# Patient Record
Sex: Female | Born: 1970 | Hispanic: Yes | Marital: Married | Smoking: Never smoker
Health system: Southern US, Community
[De-identification: ages and names within clinical notes are randomized; demographics above are authoritative.]

---

## 2007-11-22 ENCOUNTER — Ambulatory Visit: Payer: Self-pay | Admitting: Family Medicine

## 2008-01-08 ENCOUNTER — Ambulatory Visit: Payer: Self-pay | Admitting: Pediatrics

## 2015-11-24 ENCOUNTER — Encounter: Payer: Self-pay | Admitting: Family Medicine

## 2015-11-24 ENCOUNTER — Other Ambulatory Visit: Payer: Self-pay | Admitting: Student

## 2015-11-24 DIAGNOSIS — Z1231 Encounter for screening mammogram for malignant neoplasm of breast: Secondary | ICD-10-CM

## 2015-11-24 NOTE — Progress Notes (Signed)
This encounter was created in error - please disregard.

## 2015-11-27 ENCOUNTER — Ambulatory Visit
Admission: RE | Admit: 2015-11-27 | Discharge: 2015-11-27 | Disposition: A | Discharge status: Home / Self Care | Payer: Managed Care, Other (non HMO) | Source: Ambulatory Visit | Attending: Student | Admitting: Student

## 2015-11-27 DIAGNOSIS — Z1231 Encounter for screening mammogram for malignant neoplasm of breast: Secondary | ICD-10-CM

## 2016-04-22 ENCOUNTER — Emergency Department: Payer: Managed Care, Other (non HMO)

## 2016-04-22 ENCOUNTER — Encounter: Payer: Self-pay | Admitting: Emergency Medicine

## 2016-04-22 ENCOUNTER — Emergency Department
Admission: EM | Admit: 2016-04-22 | Discharge: 2016-04-22 | Disposition: A | Discharge status: Home / Self Care | Payer: Managed Care, Other (non HMO) | Attending: Student | Admitting: Student

## 2016-04-22 DIAGNOSIS — R200 Anesthesia of skin: Secondary | ICD-10-CM | POA: Insufficient documentation

## 2016-04-22 DIAGNOSIS — R51 Headache: Secondary | ICD-10-CM | POA: Insufficient documentation

## 2016-04-22 DIAGNOSIS — R519 Headache, unspecified: Secondary | ICD-10-CM

## 2016-04-22 DIAGNOSIS — R112 Nausea with vomiting, unspecified: Secondary | ICD-10-CM | POA: Insufficient documentation

## 2016-04-22 DIAGNOSIS — R42 Dizziness and giddiness: Secondary | ICD-10-CM

## 2016-04-22 LAB — TROPONIN I: Troponin I: <0.03 ng/mL (ref <0.03)

## 2016-04-22 LAB — COMPREHENSIVE METABOLIC PANEL
ALBUMIN: 4.7 g/dL (ref 3.5–5.0)
ALT: 13 U/L — ABNORMAL LOW (ref 14–54)
AST: 20 U/L (ref 15–41)
Alkaline Phosphatase: 49 U/L (ref 38–126)
Anion gap: 9 (ref 5–15)
BUN: 10 mg/dL (ref 6–20)
CHLORIDE: 108 mmol/L (ref 101–111)
CO2: 23 mmol/L (ref 22–32)
Calcium: 9.2 mg/dL (ref 8.9–10.3)
Creatinine, Ser: 0.66 mg/dL (ref 0.44–1.00)
GFR calc Af Amer: >60 mL/min (ref >60)
GFR calc non Af Amer: >60 mL/min (ref >60)
GLUCOSE: 127 mg/dL — AB (ref 65–99)
POTASSIUM: 3.6 mmol/L (ref 3.5–5.1)
Sodium: 140 mmol/L (ref 135–145)
Total Bilirubin: 0.1 mg/dL — ABNORMAL LOW (ref 0.3–1.2)
Total Protein: 8 g/dL (ref 6.5–8.1)

## 2016-04-22 LAB — URINALYSIS COMPLETE WITH MICROSCOPIC (ARMC ONLY)
BACTERIA UA: NONE SEEN
Bilirubin Urine: NEGATIVE
Glucose, UA: NEGATIVE mg/dL
Nitrite: NEGATIVE
PH: 7 (ref 5.0–8.0)
PROTEIN: NEGATIVE mg/dL
Specific Gravity, Urine: 1.002 — ABNORMAL LOW (ref 1.005–1.030)

## 2016-04-22 LAB — CBC WITH DIFFERENTIAL/PLATELET
BASOS ABS: 0.1 10*3/uL (ref 0–0.1)
BASOS PCT: 0 %
EOS PCT: 0 %
Eosinophils Absolute: 0 10*3/uL (ref 0–0.7)
HCT: 38.4 % (ref 35.0–47.0)
Hemoglobin: 12.9 g/dL (ref 12.0–16.0)
Lymphocytes Relative: 6 %
Lymphs Abs: 0.8 10*3/uL — ABNORMAL LOW (ref 1.0–3.6)
MCH: 28.4 pg (ref 26.0–34.0)
MCHC: 33.6 g/dL (ref 32.0–36.0)
MCV: 84.4 fL (ref 80.0–100.0)
MONO ABS: 0.2 10*3/uL (ref 0.2–0.9)
Monocytes Relative: 2 %
Neutro Abs: 12.6 10*3/uL — ABNORMAL HIGH (ref 1.4–6.5)
Neutrophils Relative %: 92 %
PLATELETS: 219 10*3/uL (ref 150–440)
RBC: 4.54 MIL/uL (ref 3.80–5.20)
RDW: 14.8 % — AB (ref 11.5–14.5)
WBC: 13.7 10*3/uL — ABNORMAL HIGH (ref 3.6–11.0)

## 2016-04-22 LAB — POC URINE PREG, ED: Preg Test, Ur: NEGATIVE

## 2016-04-22 MED ORDER — KETOROLAC TROMETHAMINE 30 MG/ML IJ SOLN
15.0000 mg | Freq: Once | INTRAMUSCULAR | Status: AC
  Administered 2016-04-22: 15 mg via INTRAVENOUS
  Filled 2016-04-22: qty 1

## 2016-04-22 MED ORDER — ONDANSETRON HCL 4 MG/2ML IJ SOLN
INTRAMUSCULAR | Status: AC
  Administered 2016-04-22: 4 mg via INTRAVENOUS
  Filled 2016-04-22: qty 2

## 2016-04-22 MED ORDER — SODIUM CHLORIDE 0.9 % IV BOLUS (SEPSIS)
1000.0000 mL | Freq: Once | INTRAVENOUS | Status: AC
  Administered 2016-04-22: 1000 mL via INTRAVENOUS

## 2016-04-22 MED ORDER — DIAZEPAM 5 MG PO TABS: 5.0000 mg | ORAL_TABLET | Freq: Two times a day (BID) | ORAL | 0 refills | Status: AC | PRN

## 2016-04-22 MED ORDER — LORAZEPAM 2 MG/ML IJ SOLN: 0.5000 mg | Freq: Once | INTRAMUSCULAR | Status: DC

## 2016-04-22 MED ORDER — MECLIZINE HCL 25 MG PO TABS
25.0000 mg | ORAL_TABLET | Freq: Once | ORAL | Status: AC
  Administered 2016-04-22: 25 mg via ORAL

## 2016-04-22 MED ORDER — ONDANSETRON HCL 4 MG/2ML IJ SOLN
4.0000 mg | Freq: Once | INTRAMUSCULAR | Status: AC
  Administered 2016-04-22: 4 mg via INTRAVENOUS

## 2016-04-22 MED ORDER — SODIUM CHLORIDE 0.9 % IV BOLUS (SEPSIS)
500.0000 mL | Freq: Once | INTRAVENOUS | Status: AC
  Administered 2016-04-22: 500 mL via INTRAVENOUS

## 2016-04-22 MED ORDER — LORAZEPAM 2 MG/ML IJ SOLN
0.5000 mg | Freq: Once | INTRAMUSCULAR | Status: AC
  Administered 2016-04-22: 0.5 mg via INTRAVENOUS
  Filled 2016-04-22: qty 1

## 2016-04-22 MED ORDER — MECLIZINE HCL 25 MG PO TABS: 25.0000 mg | ORAL_TABLET | Freq: Three times a day (TID) | ORAL | 0 refills | Status: AC

## 2016-04-22 MED ORDER — MECLIZINE HCL 25 MG PO TABS
ORAL_TABLET | ORAL | Status: AC
  Administered 2016-04-22: 25 mg via ORAL
  Filled 2016-04-22: qty 1

## 2016-04-22 NOTE — ED Notes (Signed)
MD at bedside. 

## 2016-04-22 NOTE — ED Notes (Addendum)
This nurse received phone call that patient's call light was going off at 1448.  This nurse went to check on patient at 1451.  Patient was not found in bed, 2 side rails were up, monitoring cords had been taken off, and bathroom door was closed.  This nurse knocked on bathroom door with no response but heard the toilet flush.  The patient opened the door and was standing at doorway.  The patient looked at this nurse and then leaned against the doorframe.  This nurse grabbed patient underneath her right arm to steady patient.  The patient then went completely limp in this nurses arms, eyes open, but not verbally responding to nurses requests.  This nurse gently lowered patient to sitting position on the floor.  Patient's arms and legs started shaking, patient responsive to painful stimuli at this time.  Dr. Inocencio HomesGayle walked into room at this time and assessed patient.  The patient got back into bed with minor assistance.  The interpretor was paged.  Per interpretor the patient used the call bell 5 times asking for assistance without any, states while using the bathroom she was trying to use the urine cup for a specimen and said she fell forward hitting her head on the handrail.  Patient with no redness or swelling to mid forehead and denies LOC.

## 2016-04-22 NOTE — ED Notes (Signed)
Patient ambulated with the assistance of MD with this RN.  Patient ambulated well with good gait and mobility.

## 2016-04-22 NOTE — ED Triage Notes (Addendum)
Pt comes into the ED via POV with her husband c/o N/V.  Patient brought back to room due to her becoming non-responsive to voice and became limp.  Patient placed in wheel chair and brought back to the room before triage.   Patient began staring into space and not answering questions until babinski reflex was performed.  Pupils reactive and brisk.  Patient has even and unlabored respirations but presents with tremors to bilateral legs.  Patient's husband denies any medical conditions and states she has been feeling Nausea and weak since this morning.

## 2016-04-22 NOTE — ED Notes (Signed)
MD updating family on patient care.

## 2016-04-22 NOTE — ED Notes (Signed)
Patient transported to MRI 

## 2016-04-22 NOTE — ED Provider Notes (Addendum)
-----------------------------------------   5:55 PM on 04/22/2016 -----------------------------------------   Blood pressure 106/63, pulse 71, temperature 98.2 F (36.8 C), temperature source Oral, resp. rate 14, height 5\' 2"  (1.575 m), weight 135 lb (61.2 kg), SpO2 99 %.  Assuming care from Dr. Inocencio HomesGayle of Becky AugustaMartha Oliva Carillo is a 45 y.o. female with a chief complaint of Nausea and Altered Mental Status .    In summary, [age] year old female who presents for evaluation of positional intermittent vertigo since this morning. Patient had negative MRI. Workup essentially unremarkable. Plan to reassess aftre PO meclizine, ativan, and IVF.  Patient ambulated with no difficulty at this time. Reports that her vertigo was markedly improved and symptoms very mild with movement but resolving within a few seconds. Normal neuro exam with no nystagmus, normal cranial nerves, no dysphasia, no dysarthria, normal palate elevation, no dysmetria, no pronator drift. 2nd troponin is negative.   She'll be discharged on meclizine, Valium, and Epley exercises.    Nita Sicklearolina Zuriyah Shatz, MD 04/22/16 [phone]

## 2016-04-22 NOTE — ED Provider Notes (Addendum)
[facility] Emergency Department Provider Note   ____________________________________________   First MD Initiated Contact with Patient 04/22/16 1239     (approximate)  I have reviewed the triage vital signs and the nursing notes.   HISTORY  Chief Complaint Nausea and Altered Mental Status    HPI Dominique Bryan is a 45 y.o. female with no chronic medical problems who presents for evaluation of nausea vomiting as well as room spinning dizziness intermittent since this morning, severe, worse when she moves her head. Patient is also had several episodes of nonbloody nonbilious emesis per she has never had anything like this before. She reports that the left side of her head hurts and feels "inflamed". She is complaining of some numbness and tingling in the left arm and face which she first noted at 7 AM this morning. No chest pain or difficulty breathing. No abdominal pain. No fevers or chills. Husband reports that she was at a funeral last night and is very "sensitive to peoples energy", he is wondering whether not that could be also affecting the patient.   History reviewed. No pertinent past medical history.  There are no active problems to display for this patient.   History reviewed. No pertinent surgical history.  Prior to Admission medications   Not on File    Allergies Review of patient's allergies indicates no known allergies.  No family history on file.  Social History Social History  Substance Use Topics  . Smoking status: Never Smoker  . Smokeless tobacco: Never Used  . Alcohol use No    Review of Systems Constitutional: No fever/chills Eyes: No visual changes. ENT: No sore throat. Cardiovascular: Denies chest pain. Respiratory: Denies shortness of breath. Gastrointestinal: No abdominal pain.  + nausea, + vomiting.  No diarrhea.  No constipation. Genitourinary: Negative for dysuria. Musculoskeletal: Negative for back  pain. Skin: Negative for rash. Neurological: Positive for headaches, +for numbness.  10-point ROS otherwise negative.  ____________________________________________   PHYSICAL EXAM:  Vitals:   04/22/16 1300 04/22/16 1507 04/22/16 1530 04/22/16 1600  BP: 113/68 116/69 126/77 126/68  Pulse: 77 75 80 74  Resp: 20 12 11  (!) 9  Temp:      TempSrc:      SpO2: 100% 100% 100% 100%  Weight:      Height:        VITAL SIGNS: ED Triage Vitals  Enc Vitals Group     BP 04/22/16 1235 (!) 157/96     Pulse Rate 04/22/16 1235 94     Resp 04/22/16 1235 18     Temp 04/22/16 1235 98.2 F (36.8 C)     Temp Source 04/22/16 1235 Oral     SpO2 04/22/16 1235 100 %     Weight 04/22/16 1225 120 lb (54.4 kg)     Height 04/22/16 1225 5\' 2"  (1.575 m)     Head Circumference --      Peak Flow --      Pain Score --      Pain Loc --      Pain Edu? --      Excl. in [place]? --     Constitutional: Alert and oriented. She appears very anxious, is tearful, tremulous in all extremities however this tremulousness resolves with distraction. Eyes: Conjunctivae are normal. PERRL. EOMI. Head: Atraumatic. Nose: No congestion/rhinnorhea. Ears: clear TMs bilaterally Mouth/Throat: Mucous membranes are moist.  Oropharynx non-erythematous. Neck: No stridor.  Supple without meningismus. Cardiovascular: Normal rate, regular rhythm. Grossly  normal heart sounds.  Good peripheral circulation. Respiratory: Normal respiratory effort.  No retractions. Lungs CTAB. Gastrointestinal: Soft and nontender. No distention.  No CVA tenderness. Genitourinary: Deferred Musculoskeletal: No lower extremity tenderness nor edema.  No joint effusions. Neurologic:  Normal speech and language. 5 out of 5 strength in bilateral upper and lower extremities, sensation is decreased to light touch in the left arm and face but otherwise intact sensation to light touch. 5 out of 5 strength in bilateral upper and lower extremities, cranial nerves II  through XII intact with the exception of slightly decreased sensation in the left face. Normal finger-nose-finger. She does have reproducible room spinning dizziness when she sits forward/changes positions. Skin:  Skin is warm, dry and intact. No rash noted. Psychiatric: Mood is anxious, affect is restricted. Speech is normal.  ____________________________________________   LABS (all labs ordered are listed, but only abnormal results are displayed)  Labs Reviewed  CBC WITH DIFFERENTIAL/PLATELET - Abnormal; Notable for the following:       Result Value   WBC 13.7 (*)    RDW 14.8 (*)    Neutro Abs 12.6 (*)    Lymphs Abs 0.8 (*)    All other components within normal limits  COMPREHENSIVE METABOLIC PANEL - Abnormal; Notable for the following:    Glucose, Bld 127 (*)    ALT 13 (*)    Total Bilirubin 0.1 (*)    All other components within normal limits  URINALYSIS COMPLETEWITH MICROSCOPIC ([facility] ONLY) - Abnormal; Notable for the following:    Color, Urine COLORLESS (*)    APPearance CLEAR (*)    Ketones, ur TRACE (*)    Specific Gravity, Urine 1.002 (*)    Hgb urine dipstick 2+ (*)    Leukocytes, UA TRACE (*)    Squamous Epithelial / LPF 0-5 (*)    All other components within normal limits  TROPONIN I  TROPONIN I  POC URINE PREG, ED   ____________________________________________  EKG  ED ECG REPORT I, Gayla Doss, the attending physician, personally viewed and interpreted this ECG.   Date: 04/22/2016  EKG Time: 12:39  Rate: 84  Rhythm: normal sinus rhythm  Axis: normal  Intervals:none  ST&T Change: No acute ST elevation. Minimal ST depression in lead 2, V4, V5.  ____________________________________________  RADIOLOGY  MRI brain IMPRESSION:  Normal MRI of the brain.      ____________________________________________   PROCEDURES  Procedure(s) performed: None  Procedures  Critical Care performed:  No  ____________________________________________   INITIAL IMPRESSION / ASSESSMENT AND PLAN / ED COURSE  Pertinent labs & imaging results that were available during my care of the patient were reviewed by me and considered in my medical decision making (see chart for details).  Dominique Augusta is a 45 y.o. female with no chronic medical problems who presents for evaluation of nausea vomiting as well as room spinning dizziness intermittent since this morning, severe, worse when she moves her head. On exam, she is severely anxious-appearing, tremulous however this resolves with distraction. Her vital signs are stable, she is afebrile. According to the nursing note, she was "staring into space" at times however on my assessment she is awake, alert, oriented. It is unclear to me what this initial staring episode was as I did not witness it, perhaps therewas confusion related to language barrier. She is complaining of pain in the left side of her head as well as a sensation that her head is "inflamed". She has reproducible dizziness with changes  in position and based on her description I suspect that this likely represents benign positional vertigo however given her presentation, how symptomatic she is in her complaint of subjective decreased sensation in the left side, we'll obtain MRI of the brain to evaluate for acute CVA. She denies any history of CVA or any family history of early CVA. EKG with minimal ST depression in isolated leads, no prior available for comparison, she denies chest pain or difficulty breathing, will check screening labs, cardiac enzymes, treat her symptomatically and reassess for disposition.  ----------------------------------------- 3:33 PM on 04/22/2016 ----------------------------------------- MRI is normal which is reassuring. I reviewed the patient's labs which show a mild leukocytosis which is nonspecific as well as normal CMP, negative troponin. Urinalysis not  consistent with infection. Patient reported to me that she got up from bed, walk to the bathroom and urinated but felt very dizzy and this caused her to collapse on the floor. She did not lose consciousness. She reports that the meclizine was helpful however her room spinning dizziness has recurred at this time. We'll treat with Ativan as I suspect some component of this is likely anxiety related though I think that she does have benign positional vertigo, we'll treat her headache and reassess for disposition. 2nd troponin pending given nonspecific EKG abnormalities. Care transferred to Dr. Don Perking at this time.  Clinical Course     ____________________________________________   FINAL CLINICAL IMPRESSION(S) / ED DIAGNOSES  Final diagnoses:  Vertigo  Acute nonintractable headache, unspecified headache type      NEW MEDICATIONS STARTED DURING THIS VISIT:  New Prescriptions   No medications on file     Note:  This document was prepared using Dragon voice recognition software and may include unintentional dictation errors.    Gayla Doss, MD 04/22/16 1536    Gayla Doss, MD 04/22/16 [phone]

## 2016-04-22 NOTE — ED Notes (Signed)
MD at bedside and patient is alert and oriented x4 while answering all questions .

## 2018-09-05 ENCOUNTER — Other Ambulatory Visit: Payer: Self-pay | Admitting: Family Medicine

## 2018-09-05 DIAGNOSIS — N644 Mastodynia: Secondary | ICD-10-CM

## 2018-09-05 DIAGNOSIS — N6310 Unspecified lump in the right breast, unspecified quadrant: Secondary | ICD-10-CM

## 2020-04-07 ENCOUNTER — Other Ambulatory Visit: Payer: Self-pay

## 2020-04-07 ENCOUNTER — Observation Stay
Admission: EM | Admit: 2020-04-07 | Discharge: 2020-04-08 | Disposition: A | Discharge status: Home / Self Care | Payer: BC Managed Care – PPO | Attending: Obstetrics and Gynecology | Admitting: Obstetrics and Gynecology

## 2020-04-07 ENCOUNTER — Encounter: Payer: Self-pay | Admitting: Emergency Medicine

## 2020-04-07 ENCOUNTER — Emergency Department: Payer: BC Managed Care – PPO

## 2020-04-07 DIAGNOSIS — R202 Paresthesia of skin: Secondary | ICD-10-CM | POA: Insufficient documentation

## 2020-04-07 DIAGNOSIS — Z20822 Contact with and (suspected) exposure to covid-19: Secondary | ICD-10-CM | POA: Diagnosis not present

## 2020-04-07 DIAGNOSIS — R519 Headache, unspecified: Principal | ICD-10-CM | POA: Insufficient documentation

## 2020-04-07 DIAGNOSIS — I639 Cerebral infarction, unspecified: Secondary | ICD-10-CM

## 2020-04-07 DIAGNOSIS — R29818 Other symptoms and signs involving the nervous system: Secondary | ICD-10-CM

## 2020-04-07 DIAGNOSIS — H539 Unspecified visual disturbance: Secondary | ICD-10-CM

## 2020-04-07 LAB — DIFFERENTIAL
Abs Immature Granulocytes: 0.03 10*3/uL (ref 0.00–0.07)
Basophils Absolute: 0.1 10*3/uL (ref 0.0–0.1)
Basophils Relative: 1 %
Eosinophils Absolute: 0.3 10*3/uL (ref 0.0–0.5)
Eosinophils Relative: 3 %
Immature Granulocytes: 0 %
Lymphocytes Relative: 16 %
Lymphs Abs: 1.5 10*3/uL (ref 0.7–4.0)
Monocytes Absolute: 0.6 10*3/uL (ref 0.1–1.0)
Monocytes Relative: 6 %
Neutro Abs: 6.8 10*3/uL (ref 1.7–7.7)
Neutrophils Relative %: 74 %

## 2020-04-07 LAB — COMPREHENSIVE METABOLIC PANEL
ALT: 21 U/L (ref 0–44)
AST: 23 U/L (ref 15–41)
Albumin: 4 g/dL (ref 3.5–5.0)
Alkaline Phosphatase: 74 U/L (ref 38–126)
Anion gap: 8 (ref 5–15)
BUN: 11 mg/dL (ref 6–20)
CO2: 24 mmol/L (ref 22–32)
Calcium: 8.6 mg/dL — ABNORMAL LOW (ref 8.9–10.3)
Chloride: 105 mmol/L (ref 98–111)
Creatinine, Ser: 0.64 mg/dL (ref 0.44–1.00)
GFR calc Af Amer: >60 mL/min (ref >60)
GFR calc non Af Amer: >60 mL/min (ref >60)
Glucose, Bld: 99 mg/dL (ref 70–99)
Potassium: 3.8 mmol/L (ref 3.5–5.1)
Sodium: 137 mmol/L (ref 135–145)
Total Bilirubin: 0.5 mg/dL (ref 0.3–1.2)
Total Protein: 7.7 g/dL (ref 6.5–8.1)

## 2020-04-07 LAB — CBC
HCT: 33.8 % — ABNORMAL LOW (ref 36.0–46.0)
Hemoglobin: 10.9 g/dL — ABNORMAL LOW (ref 12.0–15.0)
MCH: 27.8 pg (ref 26.0–34.0)
MCHC: 32.2 g/dL (ref 30.0–36.0)
MCV: 86.2 fL (ref 80.0–100.0)
Platelets: 307 10*3/uL (ref 150–400)
RBC: 3.92 MIL/uL (ref 3.87–5.11)
RDW: 14.2 % (ref 11.5–15.5)
WBC: 9.2 10*3/uL (ref 4.0–10.5)
nRBC: 0 % (ref 0.0–0.2)

## 2020-04-07 LAB — PROTIME-INR
INR: 1 (ref 0.8–1.2)
Prothrombin Time: 12.8 seconds (ref 11.4–15.2)

## 2020-04-07 LAB — APTT: aPTT: 30 seconds (ref 24–36)

## 2020-04-07 LAB — SEDIMENTATION RATE: Sed Rate: 27 mm/hr (ref 0–30)

## 2020-04-07 MED ORDER — DIPHENHYDRAMINE HCL 50 MG/ML IJ SOLN
25.0000 mg | Freq: Once | INTRAMUSCULAR | Status: AC
  Administered 2020-04-07: 25 mg via INTRAVENOUS
  Filled 2020-04-07: qty 1

## 2020-04-07 MED ORDER — SODIUM CHLORIDE 0.9% FLUSH: 3.0000 mL | Freq: Once | INTRAVENOUS | Status: DC

## 2020-04-07 MED ORDER — PROCHLORPERAZINE EDISYLATE 10 MG/2ML IJ SOLN
10.0000 mg | Freq: Once | INTRAMUSCULAR | Status: AC
  Administered 2020-04-07: 10 mg via INTRAVENOUS
  Filled 2020-04-07: qty 2

## 2020-04-07 NOTE — ED Notes (Signed)
Report off to chrissy rn c pod nurse 

## 2020-04-07 NOTE — Consult Note (Signed)
[facility] Tele[facility]urology Consult Services  Stat Consult  Date of Service:   04/07/2020 21:53:23  Impression:     .  R51.9 - Headache, unspecified  Comments/Sign-Out: 49 yr old woman, no known med hx, with headache, L sided and vision changes, bright lights, blurriness, L arm numbness since 3 days. NIH 1 for L arm sensory changes. CT head no acute changes, empty sella and sinus changes.   Diff Dx: complex migraine, r/o CVA, r/o CNS structural lesions, idiopathic intracranial hypertension syndrome, venous thrombosis, other. She is out of therapeutic window for any neuro intervention or alteplase since symptoms have been going on for > 72 hours.   Rec: - Headache treatment - MRI brain, w, wout gad, consider MRV and MRA brain. - consider LP if MRI unremarkable, and she is still symptomatic. - may need ophthalmological work up. - ASA - Neurology follow up d/w ER all of the above recs.  CT HEAD: Showed No Acute Hemorrhage or Acute Core Infarct Reviewed no acute changes, sinus changes, partial empty sella, ? idiopathic intracranial hypertension syndrome versus benign incidental finding.  Metrics: [employer] Notification Time: 04/07/2020 21:50:29 Stamp Time: 04/07/2020 21:53:23 Callback Response Time: 04/07/2020 21:52:50  Our recommendations are outlined below.  Recommendations:     .  Antiplatelet Therapy     .  Rec:     .  - Headache treatment     .  - MRI brain, w, wout gad, consider MRV and MRA brain.     .  - consider LP if MRI unremarkable, and she is still symptomatic.     .  - may need ophthalmological work up.     .  - ASA     .  - Neurology follow up     .  d/w ER all of the above recs.  Imaging Studies:     .  MRI Head with and Without Contrast  Therapies:     .  Physical Therapy, Occupational Therapy, Speech Therapy Assessment When Applicable  Other WorkUp:     .  Infectious/metabolic workup per primary team  Disposition: Neurology  Follow Up Recommended  Sign Out:     .  Discussed with Emergency Department Provider  ----------------------------------------------------------------------------------------------------  Chief Complaint: headache, Hx , physical exam done with Spanish Interpreter.  History of Present Illness: Patient is a 49 year old Female.  49 yr old woman, reports headache, L sided for 3 days, with fluctuating visual symptoms. She reports L more than R blurry vision and bright lights. She has had intermittent symptoms, came to ER today. She reports L arm has been tingling for last 3 days, since friday. She denied trauma, no diplopia, no speech changes, no focal weakness. She denied hx of CVA, HTN, DM, lipids, CAD, no cardiac hx per her. Hx and exam done with Spanish Interpreter.   Anticoagulant use:  No  Antiplatelet use: No    Examination: BP(128/81), Pulse(88), Blood Glucose(99) 1A: Level of Consciousness - Alert; keenly responsive + 0 1B: Ask Month and Age - Both Questions Right + 0 1C: Blink Eyes & Squeeze Hands - Performs Both Tasks + 0 2: Test Horizontal Extraocular Movements - Normal + 0 3: Test Visual Fields - No Visual Loss + 0 4: Test Facial Palsy (Use Grimace if Obtunded) - Normal symmetry + 0 5A: Test Left Arm Motor Drift - No Drift for 10 Seconds + 0 5B: Test Right Arm Motor Drift - No Drift for 10 Seconds + 0 6A: Test  Left Leg Motor Drift - No Drift for 5 Seconds + 0 6B: Test Right Leg Motor Drift - No Drift for 5 Seconds + 0 7: Test Limb Ataxia (FNF/Heel-Shin) - No Ataxia + 0 8: Test Sensation - Mild-Moderate Loss: Less Sharp/More Dull + 1 9: Test Language/Aphasia - Normal; No aphasia + 0 10: Test Dysarthria - Normal + 0 11: Test Extinction/Inattention - No abnormality + 0  NIHSS Score: 1   Patient/Family was informed the Neurology Consult would occur via TeleHealth consult by way of interactive audio and video telecommunications and consented to receiving care in this  manner.  Patient is being evaluated for possible acute neurologic impairment and high probability of imminent or life-threatening deterioration. I spent total of 25 minutes providing care to this patient, including time for face to face visit via telemedicine, review of medical records, imaging studies and discussion of findings with providers, the patient and/or family.   Dr Rubye Oaks   [facility] [phone]  Case [id number]

## 2020-04-07 NOTE — ED Notes (Signed)
Tele neuro consult now

## 2020-04-07 NOTE — ED Provider Notes (Signed)
[facility] Emergency Department Provider Note   ____________________________________________   First MD Initiated Contact with Patient 04/07/20 2107     (approximate)  I have reviewed the triage vital signs and the nursing notes.   HISTORY  Chief Complaint Headache    HPI Dominique Bryan is a 49 y.o. female with no significant past medical history who presents to the ED complaining of headache.  Patient reports she has had stabbing pain to the left side of her head for the past 3 to 4 days which is associated with bilateral vision changes along with left arm numbness and weakness.  She first noticed the weakness in her left arm 2 days ago with difficulty with her vision starting earlier today.  She states the vision seems blurry in both of her eyes but worse in her left eye.  She will occasionally have double vision and also reports that colors seem abnormal in her left eye.  She denies history of migraines or other headaches, has never had similar neurologic symptoms in the past.  She has not had any fevers, neck stiffness, cough, chest pain, or shortness of breath.  She has not taken anything for her headache prior to arrival.        History reviewed. No pertinent past medical history.  Patient Active Problem List   Diagnosis Date Noted  . Headache 04/07/2020    History reviewed. No pertinent surgical history.  Prior to Admission medications   Not on File    Allergies Patient has no known allergies.  History reviewed. No pertinent family history.  Social History Social History   Tobacco Use  . Smoking status: Never Smoker  . Smokeless tobacco: Never Used  Substance Use Topics  . Alcohol use: Never  . Drug use: Never    Review of Systems  Constitutional: No fever/chills Eyes: Positive for visual changes. ENT: No sore throat. Cardiovascular: Denies chest pain. Respiratory: Denies shortness of breath. Gastrointestinal: No  abdominal pain.  No nausea, no vomiting.  No diarrhea.  No constipation. Genitourinary: Negative for dysuria. Musculoskeletal: Negative for back pain. Skin: Negative for rash. Neurological: Positive for headaches, focal weakness and numbness.  ____________________________________________   PHYSICAL EXAM:  VITAL SIGNS: ED Triage Vitals  Enc Vitals Group     BP 04/07/20 1724 128/81     Pulse Rate 04/07/20 1724 78     Resp 04/07/20 1724 16     Temp 04/07/20 1724 98.9 F (37.2 C)     Temp Source 04/07/20 1724 Oral     SpO2 04/07/20 1724 100 %     Weight 04/07/20 1729 130 lb (59 kg)     Height 04/07/20 1729 5' 2.21" (1.58 m)     Head Circumference --      Peak Flow --      Pain Score 04/07/20 1724 8     Pain Loc --      Pain Edu? --      Excl. in [place]? --     Constitutional: Alert and oriented. Eyes: Conjunctivae are normal.  Pupils equal round and reactive to light bilaterally, extraocular movements intact. Head: Atraumatic. Nose: No congestion/rhinnorhea. Mouth/Throat: Mucous membranes are moist. Neck: Normal ROM Cardiovascular: Normal rate, regular rhythm. Grossly normal heart sounds. Respiratory: Normal respiratory effort.  No retractions. Lungs CTAB. Gastrointestinal: Soft and nontender. No distention. Genitourinary: deferred Musculoskeletal: No lower extremity tenderness nor edema. Neurologic:  Normal speech and language.  Decrease sensation noted over left upper extremity subjectively,  no focal weakness noted. Skin:  Skin is warm, dry and intact. No rash noted. Psychiatric: Mood and affect are normal. Speech and behavior are normal.  ____________________________________________   LABS (all labs ordered are listed, but only abnormal results are displayed)  Labs Reviewed  CBC - Abnormal; Notable for the following components:      Result Value   Hemoglobin 10.9 (*)    HCT 33.8 (*)    All other components within normal limits  COMPREHENSIVE METABOLIC PANEL -  Abnormal; Notable for the following components:   Calcium 8.6 (*)    All other components within normal limits  SARS CORONAVIRUS 2 BY RT PCR (HOSPITAL ORDER, [place] [facility])  PROTIME-INR  APTT  DIFFERENTIAL  SEDIMENTATION RATE  CBG MONITORING, ED  POC URINE PREG, ED    PROCEDURES  Procedure(s) performed (including Critical Care):  Procedures   ____________________________________________   INITIAL IMPRESSION / ASSESSMENT AND PLAN / ED COURSE       49 year old female with no significant past medical history presents to the ED complaining of 3 to 4 days of headache along with left arm numbness and weakness and bilateral vision changes.  She seems to have decreased sensation to her left upper extremity on exam but otherwise no focal neurologic deficits, visual acuity mildly impaired at 20/25 in both eyes.  Intraocular pressures were checked and within normal limits.  CT head negative for acute process, but does show partial empty sella, which could be associated with idiopathic intracranial hypertension.  Patient treated with migraine cocktail for potential complex migraine, now states that headache is improved but other neurologic symptoms remain.  Very low suspicion for giant cell arteritis given ESR within normal limits.  Patient evaluated by teleneurologist, who recommends admission for MRI and consideration of lumbar puncture tomorrow.  I doubt meningitis at this time and LP would be most useful to assess for opening pressure.  Case discussed with hospitalist for admission.      ____________________________________________   FINAL CLINICAL IMPRESSION(S) / ED DIAGNOSES  Final diagnoses:  Acute intractable headache, unspecified headache type     ED Discharge Orders    None       Note:  This document was prepared using Dragon voice recognition software and may include unintentional dictation errors.   Blake Divine, MD 04/07/20 [phone]

## 2020-04-07 NOTE — ED Notes (Signed)
primedoc in with pt and family 

## 2020-04-07 NOTE — ED Triage Notes (Signed)
[employer] interpretor used  [id number]  Pt c/o headache to left side of head and face.  Pain started Saturday.   Reports Sunday lost vision in left eyebut it returned after she took a nap, unsure how long vision was gone.  When vision came back blurry and double in left eye.  "I see several lights of several colors".  Has had nausea.

## 2020-04-07 NOTE — ED Notes (Addendum)
Pt ambulatory to treatment room  Pt has numbness and tingling on left side of face.  Pt has blurred vision.  Pt reports a headache.  Pt has nausea.  Sx for 3 days.    Pt alert  Speech clear  md at bedside.  Interpreter on a stick used.

## 2020-04-08 ENCOUNTER — Observation Stay: Payer: BC Managed Care – PPO

## 2020-04-08 ENCOUNTER — Observation Stay
Admit: 2020-04-08 | Discharge: 2020-04-08 | Disposition: A | Discharge status: Home / Self Care | Payer: BC Managed Care – PPO | Attending: Internal Medicine | Admitting: Internal Medicine

## 2020-04-08 ENCOUNTER — Encounter: Payer: Self-pay | Admitting: Internal Medicine

## 2020-04-08 DIAGNOSIS — R29818 Other symptoms and signs involving the nervous system: Secondary | ICD-10-CM

## 2020-04-08 DIAGNOSIS — R519 Headache, unspecified: Secondary | ICD-10-CM | POA: Diagnosis not present

## 2020-04-08 LAB — IRON AND TIBC
Iron: 41 ug/dL (ref 28–170)
Saturation Ratios: 11 % (ref 10.4–31.8)
TIBC: 385 ug/dL (ref 250–450)
UIBC: 344 ug/dL

## 2020-04-08 LAB — HIV ANTIBODY (ROUTINE TESTING W REFLEX): HIV Screen 4th Generation wRfx: NONREACTIVE

## 2020-04-08 LAB — CBC
HCT: 30.6 % — ABNORMAL LOW (ref 36.0–46.0)
Hemoglobin: 10 g/dL — ABNORMAL LOW (ref 12.0–15.0)
MCH: 28.1 pg (ref 26.0–34.0)
MCHC: 32.7 g/dL (ref 30.0–36.0)
MCV: 86 fL (ref 80.0–100.0)
Platelets: 265 10*3/uL (ref 150–400)
RBC: 3.56 MIL/uL — ABNORMAL LOW (ref 3.87–5.11)
RDW: 14 % (ref 11.5–15.5)
WBC: 7 10*3/uL (ref 4.0–10.5)
nRBC: 0 % (ref 0.0–0.2)

## 2020-04-08 LAB — C-REACTIVE PROTEIN: CRP: 0.6 mg/dL (ref <1.0)

## 2020-04-08 LAB — LIPID PANEL
Cholesterol: 162 mg/dL (ref 0–200)
HDL: 44 mg/dL (ref >40)
LDL Cholesterol: 102 mg/dL — ABNORMAL HIGH (ref 0–99)
Total CHOL/HDL Ratio: 3.7 RATIO
Triglycerides: 78 mg/dL (ref <150)
VLDL: 16 mg/dL (ref 0–40)

## 2020-04-08 LAB — FERRITIN: Ferritin: 7 ng/mL — ABNORMAL LOW (ref 11–307)

## 2020-04-08 LAB — HEMOGLOBIN A1C
Hgb A1c MFr Bld: 5.4 % (ref 4.8–5.6)
Mean Plasma Glucose: 108.28 mg/dL

## 2020-04-08 LAB — SEDIMENTATION RATE: Sed Rate: 33 mm/hr — ABNORMAL HIGH (ref 0–30)

## 2020-04-08 LAB — SARS CORONAVIRUS 2 BY RT PCR (HOSPITAL ORDER, PERFORMED IN ~~LOC~~ HOSPITAL LAB): SARS Coronavirus 2: NEGATIVE

## 2020-04-08 MED ORDER — DEXAMETHASONE SODIUM PHOSPHATE 10 MG/ML IJ SOLN
10.0000 mg | Freq: Once | INTRAMUSCULAR | Status: AC
  Administered 2020-04-08: 10 mg via INTRAVENOUS
  Filled 2020-04-08: qty 1

## 2020-04-08 MED ORDER — ASPIRIN EC 81 MG PO TBEC
81.0000 mg | DELAYED_RELEASE_TABLET | Freq: Every day | ORAL | Status: DC
  Administered 2020-04-08: 81 mg via ORAL
  Filled 2020-04-08: qty 1

## 2020-04-08 MED ORDER — ACETAMINOPHEN 325 MG PO TABS: 650.0000 mg | ORAL_TABLET | ORAL | Status: DC | PRN

## 2020-04-08 MED ORDER — ACETAMINOPHEN 650 MG RE SUPP: 650.0000 mg | RECTAL | Status: DC | PRN

## 2020-04-08 MED ORDER — ENOXAPARIN SODIUM 40 MG/0.4ML ~~LOC~~ SOLN
40.0000 mg | SUBCUTANEOUS | Status: DC
  Filled 2020-04-08: qty 0.4

## 2020-04-08 MED ORDER — MAGNESIUM SULFATE 50 % IJ SOLN: 2.0000 g | Freq: Once | INTRAMUSCULAR | Status: DC

## 2020-04-08 MED ORDER — STROKE: EARLY STAGES OF RECOVERY BOOK: Freq: Once | Status: DC

## 2020-04-08 MED ORDER — MAGNESIUM SULFATE 2 GM/50ML IV SOLN
2.0000 g | Freq: Once | INTRAVENOUS | Status: AC
  Administered 2020-04-08: 2 g via INTRAVENOUS
  Filled 2020-04-08: qty 50

## 2020-04-08 MED ORDER — GADOBUTROL 1 MMOL/ML IV SOLN
5.0000 mL | Freq: Once | INTRAVENOUS | Status: AC | PRN
  Administered 2020-04-08: 5 mL via INTRAVENOUS
  Filled 2020-04-08: qty 6

## 2020-04-08 MED ORDER — ACETAMINOPHEN 160 MG/5ML PO SOLN
650.0000 mg | ORAL | Status: DC | PRN
  Filled 2020-04-08: qty 20.3

## 2020-04-08 NOTE — ED Notes (Signed)
Pt transported to MRI 

## 2020-04-08 NOTE — Consult Note (Signed)
Reason for Consult: headahce and L sided weakness Requesting Physician: Dr. Ashok Pall   CC: L sided headache    HPI: Dominique Bryan is an 49 y.o. female with no significant past medical history awas in her usual state of health until 3 days prior when she started experiencing headache in the frontal area on L side waxed and waned and then developed intermittent blurred and double vision worse on the left as well as numbness and tingling of the left arm.    Associated with the symptoms were nausea and a little dizziness.  She denied fever or chills or neck stiffness.States that a month prior her son was hospitalized with an infection, adenovirus.  Patient presented with L sided weakness that has improved.   History reviewed. No pertinent past medical history.  History reviewed. No pertinent surgical history.  Family History  Problem Relation Age of Onset  . Diabetes Mother   . Prostate cancer Father     Social History:  reports that she has never smoked. She has never used smokeless tobacco. She reports that she does not drink alcohol and does not use drugs.  No Known Allergies  Medications: Prior to Admission: (Not in a hospital admission)   ROS: History obtained from the patient  General ROS: negative for - chills, fatigue, fever, night sweats, weight gain or weight loss Psychological ROS: negative for - behavioral disorder, hallucinations, memory difficulties, mood swings or suicidal ideation Ophthalmic ROS: negative for - blurry vision, double vision, eye pain or loss of vision ENT ROS: negative for - epistaxis, nasal discharge, oral lesions, sore throat, tinnitus or vertigo Allergy and Immunology ROS: negative for - hives or itchy/watery eyes Hematological and Lymphatic ROS: negative for - bleeding problems, bruising or swollen lymph nodes Endocrine ROS: negative for - galactorrhea, hair pattern changes, polydipsia/polyuria or temperature intolerance Respiratory ROS: negative  for - cough, hemoptysis, shortness of breath or wheezing Cardiovascular ROS: negative for - chest pain, dyspnea on exertion, edema or irregular heartbeat Gastrointestinal ROS: negative for - abdominal pain, diarrhea, hematemesis, nausea/vomiting or stool incontinence Genito-Urinary ROS: negative for - dysuria, hematuria, incontinence or urinary frequency/urgency Musculoskeletal ROS: negative for - joint swelling or muscular weakness Neurological ROS: as noted in HPI Dermatological ROS: negative for rash and skin lesion changes  Physical Examination: Blood pressure 99/63, pulse 86, temperature 98.9 F (37.2 C), temperature source Oral, resp. rate 16, height 5' 2.21" (1.58 m), weight 59 kg, last menstrual period 04/07/2020, SpO2 99 %.   Neurological Examination   Mental Status: Alert, oriented, thought content appropriate.  Speech fluent without evidence of aphasia.  Able to follow 3 step commands without difficulty. Pt is spanish speaking  Cranial Nerves: II: Discs flat bilaterally; Visual fields grossly normal, pupils equal, round, reactive to light and accommodation III,IV, VI: ptosis not present, extra-ocular motions intact bilaterally V,VII: smile symmetric, facial light touch sensation normal bilaterally VIII: hearing normal bilaterally IX,X: gag reflex present XI: bilateral shoulder shrug XII: midline tongue extension Motor: Right : Upper extremity   5/5    Left:     Upper extremity   5/5  Lower extremity   5/5     Lower extremity   5/5 Tone and bulk:normal tone throughout; no atrophy noted Sensory: Pinprick and light touch intact throughout, bilaterally Deep Tendon Reflexes: 2+ and symmetric throughout Plantars: Right: downgoing   Left: downgoing Cerebellar: normal finger-to-nose, normal rapid alternating movements and normal heel-to-shin test Gait: not tested      Laboratory Studies:  Basic Metabolic Panel: Recent Labs  Lab 04/07/20 1739  NA 137  K 3.8  CL 105   CO2 24  GLUCOSE 99  BUN 11  CREATININE 0.64  CALCIUM 8.6*    Liver Function Tests: Recent Labs  Lab 04/07/20 1739  AST 23  ALT 21  ALKPHOS 74  BILITOT 0.5  PROT 7.7  ALBUMIN 4.0   No results for input(s): LIPASE, AMYLASE in the last 168 hours. No results for input(s): AMMONIA in the last 168 hours.  CBC: Recent Labs  Lab 04/07/20 1739  WBC 9.2  NEUTROABS 6.8  HGB 10.9*  HCT 33.8*  MCV 86.2  PLT 307    Cardiac Enzymes: No results for input(s): CKTOTAL, CKMB, CKMBINDEX, TROPONINI in the last 168 hours.  BNP: Invalid input(s): POCBNP  CBG: No results for input(s): GLUCAP in the last 168 hours.  Microbiology: Results for orders placed or performed during the hospital encounter of 04/07/20  SARS Coronavirus 2 by RT PCR (hospital order, performed in [facility] hospital lab) Nasopharyngeal Nasopharyngeal Swab     Status: None   Collection Time: 04/07/20 10:49 PM   Specimen: Nasopharyngeal Swab  Result Value Ref Range Status   SARS Coronavirus 2 NEGATIVE NEGATIVE Final    Comment: (NOTE) SARS-CoV-2 target nucleic acids are NOT DETECTED.  The SARS-CoV-2 RNA is generally detectable in upper and lower respiratory specimens during the acute phase of infection. The lowest concentration of SARS-CoV-2 viral copies this assay can detect is 250 copies / mL. A negative result does not preclude SARS-CoV-2 infection and should not be used as the sole basis for treatment or other patient management decisions.  A negative result may occur with improper specimen collection / handling, submission of specimen other than nasopharyngeal swab, presence of viral mutation(s) within the areas targeted by this assay, and inadequate number of viral copies (<250 copies / mL). A negative result must be combined with clinical observations, patient history, and epidemiological information.  Fact Sheet for Patients:   BoilerBrush.com.cy  Fact Sheet for  Healthcare Providers: https://pope.com/  This test is not yet approved or  cleared by the [place] FDA and has been authorized for detection and/or diagnosis of SARS-CoV-2 by FDA under an Emergency Use Authorization (EUA).  This EUA will remain in effect (meaning this test can be used) for the duration of the COVID-19 declaration under Section 564(b)(1) of the Act, 21 U.S.C. section 360bbb-3(b)(1), unless the authorization is terminated or revoked sooner.  Performed at [facility], [address] [place], [place] [address]     Coagulation Studies: Recent Labs    04/07/20 1739  LABPROT 12.8  INR 1.0    Urinalysis: No results for input(s): COLORURINE, LABSPEC, PHURINE, GLUCOSEU, HGBUR, BILIRUBINUR, KETONESUR, PROTEINUR, UROBILINOGEN, NITRITE, LEUKOCYTESUR in the last 168 hours.  Invalid input(s): APPERANCEUR  Lipid Panel:     Component Value Date/Time   CHOL 162 04/08/2020 0359   TRIG 78 04/08/2020 0359   HDL 44 04/08/2020 0359   CHOLHDL 3.7 04/08/2020 0359   VLDL 16 04/08/2020 0359   LDLCALC 102 (H) 04/08/2020 0359    HgbA1C: No results found for: HGBA1C  Urine Drug Screen:  No results found for: LABOPIA, COCAINSCRNUR, LABBENZ, AMPHETMU, THCU, LABBARB  Alcohol Level: No results for input(s): ETH in the last 168 hours.  Other results: EKG: normal EKG, normal sinus rhythm, unchanged from previous tracings.  Imaging: CT HEAD WO CONTRAST  Result Date: 04/07/2020 CLINICAL DATA:  Vision loss, monocular. Additional history provided:  Patient reports headache to left side of head and face, symptoms began on Saturday, loss of vision in left eye on Sunday but vision returned. EXAM: CT HEAD WITHOUT CONTRAST TECHNIQUE: Contiguous axial images were obtained from the base of the skull through the vertex without intravenous contrast. COMPARISON:  No pertinent prior exams are available for comparison. FINDINGS: Brain: Cerebral volume is normal.  There is no acute intracranial hemorrhage. No demarcated cortical infarct. No extra-axial fluid collection. No evidence of intracranial mass. No midline shift. Partially empty sella turcica. Vascular: No hyperdense vessel. Skull: Normal. Negative for fracture or focal lesion. Sinuses/Orbits: Visualized orbits show no acute finding. Frothy secretions and severe mucosal thickening within the right maxillary sinus. Scattered frothy secretions and mild mucosal thickening within bilateral ethmoid air cells. Mild right sphenoid sinus mucosal thickening. Small right frontal sinus osteoma. No significant mastoid effusion. IMPRESSION: No CT evidence of acute intracranial abnormality. Partially empty sella turcica. This finding is very commonly incidental, but can be associated with idiopathic intracranial hypertension. Paranasal sinus disease, most notably, ethmoid and right maxillary sinusitis. Electronically Signed   By: Jackey Loge DO   On: 04/07/2020 18:29   MR Brain W and Wo Contrast  Result Date: 04/08/2020 CLINICAL DATA:  Initial evaluation for acute headache. EXAM: MRI HEAD WITHOUT AND WITH CONTRAST MRV HEAD WITHOUT AND WITH CONTRAST TECHNIQUE: Multiplanar, multiecho pulse sequences of the brain and surrounding structures were obtained without and with intravenous contrast. Angiographic images of the intracranial venous structures were obtained using MRV technique without intravenous contrast. CONTRAST:  55mL GADAVIST GADOBUTROL 1 MMOL/ML IV SOLN COMPARISON:  Prior head CT from 04/07/2020. FINDINGS: MRI HEAD FINDINGS: Brain: Cerebral volume within normal limits for patient age. No focal parenchymal signal abnormality identified. No abnormal foci of restricted diffusion to suggest acute or subacute ischemia. Gray-white matter differentiation well maintained. No encephalomalacia to suggest chronic infarction. No foci of susceptibility artifact to suggest acute or chronic intracranial hemorrhage. No mass lesion,  midline shift or mass effect. No hydrocephalus. No extra-axial fluid collection. Major dural sinuses are grossly patent. Pituitary gland and suprasellar region are normal. Midline structures intact and normal. No abnormal enhancement. Vascular: Major intracranial vascular flow voids well maintained and normal in appearance. Skull and upper cervical spine: Craniocervical junction normal. Visualized upper cervical spine within normal limits. Diffusely decreased T1 signal intensity seen throughout the visualized bone marrow, nonspecific, but most commonly related to anemia, smoking, or obesity. No focal marrow replacing lesion. No scalp soft tissue abnormality. Sinuses/Orbits: Globes and orbital soft tissues within normal limits. Moderate mucosal thickening noted within the ethmoidal air cells, sphenoid sinuses, and maxillary sinuses, right greater than left. Trace right mastoid effusion noted, of doubtful significance. Inner ear structures grossly normal. Other: None. MRV HEAD FINDINGS: Normal flow related signal seen throughout the superior sagittal sinus to the level of the torcula. Torcula itself is patent. Transverse and sigmoid sinuses are patent as are the visualized proximal internal jugular veins. Straight sinus, vein of Galen, internal cerebral veins, and basal veins of Rosenthal are patent. No evidence for dural sinus thrombosis. No dural sinus stenosis identified. IMPRESSION: 1. Normal brain MRI. No acute intracranial abnormality identified. 2. Moderate paranasal sinus disease, most pronounced within the ethmoidal air cells and maxillary sinuses, right greater than left. 3. Normal intracranial MRV. No evidence for dural sinus thrombosis. Electronically Signed   By: Rise Mu M.D.   On: 04/08/2020 02:20   US Carotid Bilateral (at [facility] and [facility] only)  Result Date: 04/08/2020  CLINICAL DATA:  Stroke.  Left-sided headache.  Double vision. EXAM: BILATERAL CAROTID DUPLEX ULTRASOUND TECHNIQUE: Wallace Cullens  scale imaging, color Doppler and duplex ultrasound were performed of bilateral carotid and vertebral arteries in the neck. COMPARISON:  None. FINDINGS: Criteria: Quantification of carotid stenosis is based on velocity parameters that correlate the residual internal carotid diameter with NASCET-based stenosis levels, using the diameter of the distal internal carotid lumen as the denominator for stenosis measurement. The following velocity measurements were obtained: RIGHT ICA: 101/46 cm/sec CCA: 104/29 cm/sec SYSTOLIC ICA/CCA RATIO:  1.0 ECA: 129 cm/sec LEFT ICA: 110/47 cm/sec CCA: 81/28 cm/sec SYSTOLIC ICA/CCA RATIO:  1.4 ECA: 130 cm/sec RIGHT CAROTID ARTERY: There is no grayscale evidence of significant intimal thickening or atherosclerotic plaque affecting the interrogated portions of the right carotid system. There are no elevated peak systolic velocities within the interrogated course of the right internal carotid artery to suggest a hemodynamically significant stenosis. RIGHT VERTEBRAL ARTERY:  Antegrade flow LEFT CAROTID ARTERY: There is a very minimal amount of intimal thickening/atherosclerotic plaque involving the distal aspect left common carotid artery (image 46). There are no elevated peak systolic velocities within the interrogated course of the left internal carotid artery to suggest a hemodynamically significant stenosis. LEFT VERTEBRAL ARTERY:  Antegrade flow IMPRESSION: 1. Minimal amount of left-sided intimal thickening/atherosclerotic plaque, not resulting in a hemodynamically significant stenosis. 2. Normal sonographic evaluation of the right carotid system. Electronically Signed   By: Simonne Come M.D.   On: 04/08/2020 07:42   MR MRV HEAD W WO CONTRAST  Result Date: 04/08/2020 CLINICAL DATA:  Initial evaluation for acute headache. EXAM: MRI HEAD WITHOUT AND WITH CONTRAST MRV HEAD WITHOUT AND WITH CONTRAST TECHNIQUE: Multiplanar, multiecho pulse sequences of the brain and surrounding structures  were obtained without and with intravenous contrast. Angiographic images of the intracranial venous structures were obtained using MRV technique without intravenous contrast. CONTRAST:  91mL GADAVIST GADOBUTROL 1 MMOL/ML IV SOLN COMPARISON:  Prior head CT from 04/07/2020. FINDINGS: MRI HEAD FINDINGS: Brain: Cerebral volume within normal limits for patient age. No focal parenchymal signal abnormality identified. No abnormal foci of restricted diffusion to suggest acute or subacute ischemia. Gray-white matter differentiation well maintained. No encephalomalacia to suggest chronic infarction. No foci of susceptibility artifact to suggest acute or chronic intracranial hemorrhage. No mass lesion, midline shift or mass effect. No hydrocephalus. No extra-axial fluid collection. Major dural sinuses are grossly patent. Pituitary gland and suprasellar region are normal. Midline structures intact and normal. No abnormal enhancement. Vascular: Major intracranial vascular flow voids well maintained and normal in appearance. Skull and upper cervical spine: Craniocervical junction normal. Visualized upper cervical spine within normal limits. Diffusely decreased T1 signal intensity seen throughout the visualized bone marrow, nonspecific, but most commonly related to anemia, smoking, or obesity. No focal marrow replacing lesion. No scalp soft tissue abnormality. Sinuses/Orbits: Globes and orbital soft tissues within normal limits. Moderate mucosal thickening noted within the ethmoidal air cells, sphenoid sinuses, and maxillary sinuses, right greater than left. Trace right mastoid effusion noted, of doubtful significance. Inner ear structures grossly normal. Other: None. MRV HEAD FINDINGS: Normal flow related signal seen throughout the superior sagittal sinus to the level of the torcula. Torcula itself is patent. Transverse and sigmoid sinuses are patent as are the visualized proximal internal jugular veins. Straight sinus, vein of  Galen, internal cerebral veins, and basal veins of Rosenthal are patent. No evidence for dural sinus thrombosis. No dural sinus stenosis identified. IMPRESSION: 1. Normal brain MRI. No acute intracranial  abnormality identified. 2. Moderate paranasal sinus disease, most pronounced within the ethmoidal air cells and maxillary sinuses, right greater than left. 3. Normal intracranial MRV. No evidence for dural sinus thrombosis. Electronically Signed   By: Rise MuBenjamin  McClintock M.D.   On: 04/08/2020 02:20     Assessment/Plan:  49 y.o. female with no significant past medical history awas in her usual state of health until 3 days prior when she started experiencing headache in the frontal area on L side waxed and waned and then developed intermittent blurred and double vision worse on the left as well as numbness and tingling of the left arm.    Associated with the symptoms were nausea and a little dizziness.  She denied fever or chills or neck stiffness.States that a month prior her son was hospitalized with an infection, adenovirus.  Patient presented with L sided weakness that has improved.    - Still persistent L sided headache but weakness resolved - ocasional blurry vision persists - empty sella on CT incidental finding.  Not clear presentation for idiopathic intracranial HTN - MRI and MRV no acute abnormalities - Will give decadron 10mg  IV now with magnesium sulfate - if symptoms/headache resolve can likely be d/c after seeing ophthalmology to look at pressure or likely can see optho as out patient as well.  - suspect complicated migraine -  04/08/2020, 11:16 AM

## 2020-04-08 NOTE — H&P (Signed)
History and Physical    Hilari Wethington RSW:[id number] DOB: December 25, 1970 DOA: 04/07/2020  PCP: Pcp, No   Patient coming from: Home  I have personally briefly reviewed patient's old medical records in [facility]  Chief Complaint: Headache, visual disturbance, numbness tingling left arm x3 days  HPI: Dominique Bryan is a 49 y.o. female with no significant past medical history and who tries to eat a healthy lifestyle with low-salt and near vegan lifestyle with meditation who was in her usual state of health until 3 days prior when she started experiencing headache in the frontal area waxed and waned and then developed intermittent blurred and double vision worse on the left as well as numbness and tingling of the left arm.  The symptoms waxed and waned and she tried resting and meditating to see if it would go away which it did but then returned.  By the third day she decided to come in to have it checked out.  Associated with the symptoms were nausea and a little dizziness.  She denied fever or chills or neck stiffness.States that a month prior her son was hospitalized with an infection, adenovirus.  Some of the history is provided by husband at bedside who states that she has been stressed out for the past few months after the passing of her mother, a dog and then recent hospitalization of her son.   No history of migraines and rarely has headaches.   Overall patient states the symptoms have improved but have not completely gone away. ED Course: While in the ER, vitals were within normal limits, mild anemia of 10.9 but blood work otherwise unremarkable.  Head CT showed "partially empty sella turcica. This finding is very commonly incidental, but can be associated with idiopathic intracranial hypertension.Paranasal sinus disease, most notably, ethmoid and right maxillary sinusitis".  Telemetry neurology was consulted from the ER with recommendations for further work-up with MRI, LP if MRI  unremarkable and neurologic possible ophthalmological work-up Review of Systems: As per HPI otherwise all other systems on review of systems negative.    History reviewed. No pertinent past medical history.  History reviewed. No pertinent surgical history.   reports that she has never smoked. She has never used smokeless tobacco. She reports that she does not drink alcohol and does not use drugs.  No Known Allergies  Family History  Problem Relation Age of Onset  . Diabetes Mother   . Prostate cancer Father       Prior to Admission medications   Not on File    Physical Exam: Vitals:   04/07/20 1729 04/07/20 2209 04/07/20 2345 04/08/20 0000  BP:  (!) 157/91 127/75 123/69  Pulse:  80 82 70  Resp:  20  16  Temp:      TempSrc:      SpO2:  98% 100% 99%  Weight: 59 kg     Height: 5' 2.21" (1.58 m)        Vitals:   04/07/20 1729 04/07/20 2209 04/07/20 2345 04/08/20 0000  BP:  (!) 157/91 127/75 123/69  Pulse:  80 82 70  Resp:  20  16  Temp:      TempSrc:      SpO2:  98% 100% 99%  Weight: 59 kg     Height: 5' 2.21" (1.58 m)         Constitutional: Alert and oriented x 3 . Not in any apparent distress HEENT:      Head: Normocephalic and  atraumatic.         Eyes: PERLA, EOMI, Conjunctivae are normal. Sclera is non-icteric.       Mouth/Throat: Mucous membranes are moist.       Neck: Supple with no signs of meningismus. Cardiovascular: Regular rate and rhythm. No murmurs, gallops, or rubs. 2+ symmetrical distal pulses are present . No JVD. No LE edema Respiratory: Respiratory effort normal .Lungs sounds clear bilaterally. No wheezes, crackles, or rhonchi.  Gastrointestinal: Soft, non tender, and non distended with positive bowel sounds. No rebound or guarding. Genitourinary: No CVA tenderness. Musculoskeletal: Nontender with normal range of motion in all extremities. No cyanosis, or erythema of extremities. Neurologic:  Face is symmetric. Moving all extremities. No  gross focal neurologic deficits . Skin: Skin is warm, dry.  No rash or ulcers Psychiatric: Mood and affect are normal    Labs on Admission: I have personally reviewed following labs and imaging studies  CBC: Recent Labs  Lab 04/07/20 1739  WBC 9.2  NEUTROABS 6.8  HGB 10.9*  HCT 33.8*  MCV 86.2  PLT 307   Basic Metabolic Panel: Recent Labs  Lab 04/07/20 1739  NA 137  K 3.8  CL 105  CO2 24  GLUCOSE 99  BUN 11  CREATININE 0.64  CALCIUM 8.6*   GFR: Estimated Creatinine Clearance: 66.5 mL/min (by C-G formula based on SCr of 0.64 mg/dL). Liver Function Tests: Recent Labs  Lab 04/07/20 1739  AST 23  ALT 21  ALKPHOS 74  BILITOT 0.5  PROT 7.7  ALBUMIN 4.0   No results for input(s): LIPASE, AMYLASE in the last 168 hours. No results for input(s): AMMONIA in the last 168 hours. Coagulation Profile: Recent Labs  Lab 04/07/20 1739  INR 1.0   Cardiac Enzymes: No results for input(s): CKTOTAL, CKMB, CKMBINDEX, TROPONINI in the last 168 hours. BNP (last 3 results) No results for input(s): PROBNP in the last 8760 hours. HbA1C: No results for input(s): HGBA1C in the last 72 hours. CBG: No results for input(s): GLUCAP in the last 168 hours. Lipid Profile: No results for input(s): CHOL, HDL, LDLCALC, TRIG, CHOLHDL, LDLDIRECT in the last 72 hours. Thyroid Function Tests: No results for input(s): TSH, T4TOTAL, FREET4, T3FREE, THYROIDAB in the last 72 hours. Anemia Panel: No results for input(s): VITAMINB12, FOLATE, FERRITIN, TIBC, IRON, RETICCTPCT in the last 72 hours. Urine analysis: No results found for: COLORURINE, APPEARANCEUR, LABSPEC, PHURINE, GLUCOSEU, HGBUR, BILIRUBINUR, KETONESUR, PROTEINUR, UROBILINOGEN, NITRITE, LEUKOCYTESUR  Radiological Exams on Admission: CT HEAD WO CONTRAST  Result Date: 04/07/2020 CLINICAL DATA:  Vision loss, monocular. Additional history provided: Patient reports headache to left side of head and face, symptoms began on Saturday, loss  of vision in left eye on Sunday but vision returned. EXAM: CT HEAD WITHOUT CONTRAST TECHNIQUE: Contiguous axial images were obtained from the base of the skull through the vertex without intravenous contrast. COMPARISON:  No pertinent prior exams are available for comparison. FINDINGS: Brain: Cerebral volume is normal. There is no acute intracranial hemorrhage. No demarcated cortical infarct. No extra-axial fluid collection. No evidence of intracranial mass. No midline shift. Partially empty sella turcica. Vascular: No hyperdense vessel. Skull: Normal. Negative for fracture or focal lesion. Sinuses/Orbits: Visualized orbits show no acute finding. Frothy secretions and severe mucosal thickening within the right maxillary sinus. Scattered frothy secretions and mild mucosal thickening within bilateral ethmoid air cells. Mild right sphenoid sinus mucosal thickening. Small right frontal sinus osteoma. No significant mastoid effusion. IMPRESSION: No CT evidence of acute intracranial abnormality. Partially empty  sella turcica. This finding is very commonly incidental, but can be associated with idiopathic intracranial hypertension. Paranasal sinus disease, most notably, ethmoid and right maxillary sinusitis. Electronically Signed   By: Jackey Loge DO   On: 04/07/2020 18:29     Assessment/Plan 49 year old female with no significant past medical history presenting with a 3-day history of headache, visual disturbance and left-sided paresthesia    Headache   Acute focal neurologic deficit with partial resolution -Head CT showing empty sella turcica -Follow-up MRI/MRV as recommended by teleneurology -Consider LP per teleneurology recommendations if MRI studies negative -Neurology consult in the a.m. -Differential for neurology includes "complex migraine, r/o CVA, r/o CNS structural lesions, idiopathic intracranial hypertension syndrome, venous thrombosis, other" -.  Outside therapeutic window for any neuro  intervention or alteplase since symptoms have been going on for > 72 hours. -Aspirin    DVT prophylaxis: Lovenox  Code Status: full code  Family Communication:  none  Disposition Plan: Back to previous home environment Consults called: none  Status:obs    Andris Baumann MD [facility]     04/08/2020, 1:09 AM

## 2020-04-08 NOTE — ED Notes (Signed)
Patient is resting comfortably. 

## 2020-04-08 NOTE — Progress Notes (Signed)
SLP Cancellation Note  Patient Details Name: Dominique Bryan MRN: [id number] DOB: June 06, 1971   Cancelled treatment:       Reason Eval/Treat Not Completed: SLP screened, no needs identified, will sign off (chart reviewed; consulted MD). Spoke w/ Neurology who stated pt's MRI was negative w/ no acute issues. PT has assessed indicating min L arm numbness per notes. Per Neurologist, pt answered all questions at his exam; A/O, answering questions, following commands. No aphasia present.  As pt appears at her Baseline w/out issues reported re: speech or swallowing; initial complaints resolving now also and pt is to d/c home today. No ST needs identified. ST services will sign off at this time. Pease reconsult if any new needs arise.     Jerilynn Som, MS, CCC-SLP Speech Language Pathologist Rehab Services [phone] [facility] 04/08/2020, 2:39 PM

## 2020-04-08 NOTE — ED Notes (Signed)
Pt states that she has no pain, no numbness or weakness in arm. Pt oob to BR with standby assist.

## 2020-04-08 NOTE — Evaluation (Signed)
Physical Therapy Evaluation Patient Details Name: Dominique Bryan MRN: [id number] DOB: June 04, 1971 Today's Date: 04/08/2020   History of Present Illness  Pt is a 49 y.o. female presenting to hospital 9/20 with headache (stabbing pain L side of head x3-4 days with B vision changes and L arm numbness and weakness).  MRI negative for acute intracranial abnormality.  CT of head showing partially empty sella turcica (finding very commonly incidental but can be associated with idiopathic intracranial htn).  Clinical Impression  Prior to hospital admission, pt was independent with functional mobility; lives with her family.  Currently pt is modified independent with bed mobility; independent with transfers; and independent with ambulation 120 feet (no AD).  Decreased light touch noted L thigh.  Pt reporting all other symptoms resolved except vision "90%" of what it usually is (d/t reports of blurriness).  No acute PT needs identified; will sign off.  Please re-consult PT if pt's status changes and acute PT needs are identified.  Spanish interpreter utilized entire session via [employer] video interpreter (initially Jesus [phone] but then screen froze and lost interpreter so then obtained [place] [phone]).    Follow Up Recommendations No PT follow up    Equipment Recommendations  None recommended by PT    Recommendations for Other Services       Precautions / Restrictions Precautions Precautions:  (low fall risk) Restrictions Weight Bearing Restrictions: No      Mobility  Bed Mobility Overal bed mobility: Modified Independent             General bed mobility comments: Semi-supine to/from sitting without any noted difficulties  Transfers Overall transfer level: Independent Equipment used: None             General transfer comment: steady safe transfers noted  Ambulation/Gait Ambulation/Gait assistance: Independent Gait Distance (Feet): 120 Feet Assistive device: None Gait  Pattern/deviations: WFL(Within Functional Limits)     General Gait Details: steady ambulation  Stairs            Wheelchair Mobility    Modified Rankin (Stroke Patients Only)       Balance Overall balance assessment: Needs assistance Sitting-balance support: No upper extremity supported;Feet supported Sitting balance-Leahy Scale: Normal Sitting balance - Comments: steady sitting reaching outside BOS   Standing balance support: No upper extremity supported;During functional activity Standing balance-Leahy Scale: Normal Standing balance comment: steady ambulation and dynamic balance activities               High Level Balance Comments: No loss of balance with ambulation and head turns R/L/up/down, increasing/decreasing speed, and turning 180 degrees and stopping             Pertinent Vitals/Pain Pain Assessment: No/denies pain  Vitals (HR and O2 on room air) stable and WFL throughout treatment session.    Home Living Family/patient expects to be discharged to:: Private residence Living Arrangements: Spouse/significant other;Children (pt's husband and 2 children (59 and 47 y.o.)) Available Help at Discharge: Family Type of Home: House Home Access: Stairs to enter Entrance Stairs-Rails: None Secretary/administrator of Steps: 2 Home Layout: One level Home Equipment: None      Prior Function Level of Independence: Independent               Hand Dominance        Extremity/Trunk Assessment   Upper Extremity Assessment Upper Extremity Assessment: Overall WFL for tasks assessed    Lower Extremity Assessment Lower Extremity Assessment: LLE deficits/detail (Intact B LE strength, heel  to shin coordination, tone, and proprioception.  Intact R LE sensation (light touch).) LLE Deficits / Details: decreased sensation (light touch) L thigh compared to R thigh    Cervical / Trunk Assessment Cervical / Trunk Assessment: Normal  Communication    Communication: No difficulties;Prefers language other than [place];Interpreter utilized  Cognition Arousal/Alertness: Awake/alert Behavior During Therapy: WFL for tasks assessed/performed Overall Cognitive Status: Within Functional Limits for tasks assessed                                        General Comments   Nursing cleared pt for participation in physical therapy.  Pt agreeable to PT session.    Exercises     Assessment/Plan    PT Assessment Patent does not need any further PT services  PT Problem List         PT Treatment Interventions      PT Goals (Current goals can be found in the Care Plan section)  Acute Rehab PT Goals Patient Stated Goal: to go home PT Goal Formulation: With patient Time For Goal Achievement: 04/22/20 Potential to Achieve Goals: Good    Frequency     Barriers to discharge        Co-evaluation               AM-PAC PT "6 Clicks" Mobility  Outcome Measure Help needed turning from your back to your side while in a flat bed without using bedrails?: None Help needed moving from lying on your back to sitting on the side of a flat bed without using bedrails?: None Help needed moving to and from a bed to a chair (including a wheelchair)?: None Help needed standing up from a chair using your arms (e.g., wheelchair or bedside chair)?: None Help needed to walk in hospital room?: None Help needed climbing 3-5 steps with a railing? : None 6 Click Score: 24    End of Session Equipment Utilized During Treatment: Gait belt Activity Tolerance: Patient tolerated treatment well Patient left: in bed;with call bell/phone within reach Nurse Communication: Mobility status;Precautions PT Visit Diagnosis: Hemiplegia and hemiparesis Hemiplegia - Right/Left: Left Hemiplegia - caused by: Unspecified    Time: 5681-2751 PT Time Calculation (min) (ACUTE ONLY): 30 min   Charges:   PT Evaluation $PT Eval Low Complexity: 1 Low          [name], PT 04/08/20, 12:36 PM

## 2020-04-08 NOTE — Progress Notes (Signed)
PROGRESS NOTE    Dominique Bryan  ZMO:[id number] DOB: [birth year] DOA: 04/07/2020 PCP: Oneita Hurt, No  Outpatient Specialists: none    Brief Narrative:  Dominique Bryan is a 49 y.o. female with no significant past medical history and who tries to eat a healthy lifestyle with low-salt and near vegan lifestyle with meditation who was in her usual state of health until 3 days prior when she started experiencing headache in the frontal area waxed and waned and then developed intermittent blurred and double vision worse on the left as well as numbness and tingling of the left arm.  The symptoms waxed and waned and she tried resting and meditating to see if it would go away which it did but then returned.  By the third day she decided to come in to have it checked out.  Associated with the symptoms were nausea and a little dizziness.  She denied fever or chills or neck stiffness.States that a month prior her son was hospitalized with an infection, adenovirus.  Some of the history is provided by husband at bedside who states that she has been stressed out for the past few months after the passing of her mother, a dog and then recent hospitalization of her son.   No history of migraines and rarely has headaches.   Overall patient states the symptoms have improved but have not completely gone away. ED Course: While in the ER, vitals were within normal limits, mild anemia of 10.9 but blood work otherwise unremarkable.  Head CT showed "partially empty sella turcica. This finding is very commonly incidental, but can be associated with idiopathic intracranial hypertension.Paranasal sinus disease, most notably, ethmoid and right maxillary sinusitis".  Telemetry neurology was consulted from the ER with recommendations for further work-up with MRI, LP if MRI unremarkable and neurologic possible ophthalmological work-up   Assessment & Plan:   Active Problems:   Headache   Acute focal neurologic deficit with  partial resolution  49 year old female with no significant past medical history presenting with a 3-day history of headache, visual disturbance and left-sided paresthesia   Headache  Acute focal neurologic deficit with partial resolution Several days headache initially left of nose then around eye and temple. With left eye blurriness. Says had some tenderness on head as well. HA is resolved but eye remains mildly blurry. Reported paresthesias and subjective weakness left arm and left leg, now resolved. Unremarkable neuro exam today. MRI/MRV and carotid dopplers are negative. Patient reports this morning that she was diagnosed with covid last month. Those symptoms have resolved and covid test is negative. Head CT showing empty sella turcica - neurology consulted, recs pending - will check sed/crp to further eval for possible GCA - cont aspirin  Normocytic anemia No prior values available for comparison. Denies bleeding - repeat cbc, check iron studies  DVT prophylaxis: lovenox Code Status: full Family Communication: will attempt to contact husband later today  Status is: inpatient  Remains inpatient appropriate because: requires ongoing inpatient evaluation   Dispo: The patient is from: home              Anticipated d/c is to: home              Anticipated d/c date is: 7/22               Consultants:  Neurology  Procedures: none  Antimicrobials:  none    Subjective: Denies headache. Denies weakness or numbness, denies trouble with gait or confusion. No fever. No  trouble speaking or swallowing. Has mildly blurry vision left eye.   Objective: Vitals:   04/08/20 0400 04/08/20 0500 04/08/20 0530 04/08/20 0630  BP: 99/63  Pulse: 65 74 70 86  Resp: Temp:      TempSrc:      SpO2: 99% 99% 100% 99%  Weight:      Height:       No intake or output data in the 24 hours ending 04/08/20 0906 Filed Weights   04/07/20 1729  Weight: 59 kg     Examination:  General exam: Appears calm and comfortable  Respiratory system: Clear to auscultation. Respiratory effort normal. Cardiovascular system: S1 & S2 heard, RRR. No JVD, murmurs, rubs, gallops or clicks. No pedal edema. Gastrointestinal system: Abdomen is nondistended, soft and nontender. No organomegaly or masses felt. Normal bowel sounds heard. Central nervous system: Alert and oriented. No focal neurological deficits. Extremities: cn 2-12 grossly intact, 5/5 upper and lower strength, normal finger to nose Skin: No rashes, lesions or ulcers Psychiatry: Judgement and insight appear normal. Mood & affect appropriate.     Data Reviewed: I have personally reviewed following labs and imaging studies  CBC: Recent Labs  Lab 04/07/20 1739  WBC 9.2  NEUTROABS 6.8  HGB 10.9*  HCT 33.8*  MCV 86.2  PLT 307   Basic Metabolic Panel: Recent Labs  Lab 04/07/20 1739  NA 137  K 3.8  CL 105  CO2 24  GLUCOSE 99  BUN 11  CREATININE 0.64  CALCIUM 8.6*   GFR: Estimated Creatinine Clearance: 66.5 mL/min (by C-G formula based on SCr of 0.64 mg/dL). Liver Function Tests: Recent Labs  Lab 04/07/20 1739  AST 23  ALT 21  ALKPHOS 74  BILITOT 0.5  PROT 7.7  ALBUMIN 4.0   No results for input(s): LIPASE, AMYLASE in the last 168 hours. No results for input(s): AMMONIA in the last 168 hours. Coagulation Profile: Recent Labs  Lab 04/07/20 1739  INR 1.0   Cardiac Enzymes: No results for input(s): CKTOTAL, CKMB, CKMBINDEX, TROPONINI in the last 168 hours. BNP (last 3 results) No results for input(s): PROBNP in the last 8760 hours. HbA1C: No results for input(s): HGBA1C in the last 72 hours. CBG: No results for input(s): GLUCAP in the last 168 hours. Lipid Profile: Recent Labs    04/08/20 0359  CHOL 162  HDL 44  LDLCALC 102*  TRIG 78  CHOLHDL 3.7   Thyroid Function Tests: No results for input(s): TSH, T4TOTAL, FREET4, T3FREE, THYROIDAB in the last 72  hours. Anemia Panel: No results for input(s): VITAMINB12, FOLATE, FERRITIN, TIBC, IRON, RETICCTPCT in the last 72 hours. Urine analysis: No results found for: COLORURINE, APPEARANCEUR, LABSPEC, PHURINE, GLUCOSEU, HGBUR, BILIRUBINUR, KETONESUR, PROTEINUR, UROBILINOGEN, NITRITE, LEUKOCYTESUR Sepsis Labs: (procalcitonin:4,lacticidven:4)  ) Recent Results (from the past 240 hour(s))  SARS Coronavirus 2 by RT PCR (hospital order, performed in [facility] hospital lab) Nasopharyngeal Nasopharyngeal Swab     Status: None   Collection Time: 04/07/20 10:49 PM   Specimen: Nasopharyngeal Swab  Result Value Ref Range Status   SARS Coronavirus 2 NEGATIVE NEGATIVE Final    Comment: (NOTE) SARS-CoV-2 target nucleic acids are NOT DETECTED.  The SARS-CoV-2 RNA is generally detectable in upper and lower respiratory specimens during the acute phase of infection. The lowest concentration of SARS-CoV-2 viral copies this assay can detect is 250 copies / mL. A negative result does not preclude SARS-CoV-2 infection and should not be used as  the sole basis for treatment or other patient management decisions.  A negative result may occur with improper specimen collection / handling, submission of specimen other than nasopharyngeal swab, presence of viral mutation(s) within the areas targeted by this assay, and inadequate number of viral copies (<250 copies / mL). A negative result must be combined with clinical observations, patient history, and epidemiological information.  Fact Sheet for Patients:   BoilerBrush.com.cy  Fact Sheet for Healthcare Providers: https://pope.com/  This test is not yet approved or  cleared by the [place] FDA and has been authorized for detection and/or diagnosis of SARS-CoV-2 by FDA under an Emergency Use Authorization (EUA).  This EUA will remain in effect (meaning this test can be used) for the duration of  the COVID-19 declaration under Section 564(b)(1) of the Act, 21 U.S.C. section 360bbb-3(b)(1), unless the authorization is terminated or revoked sooner.  Performed at [facility], [address], [place], [place] [address]          Radiology Studies: CT HEAD WO CONTRAST  Result Date: 04/07/2020 CLINICAL DATA:  Vision loss, monocular. Additional history provided: Patient reports headache to left side of head and face, symptoms began on Saturday, loss of vision in left eye on Sunday but vision returned. EXAM: CT HEAD WITHOUT CONTRAST TECHNIQUE: Contiguous axial images were obtained from the base of the skull through the vertex without intravenous contrast. COMPARISON:  No pertinent prior exams are available for comparison. FINDINGS: Brain: Cerebral volume is normal. There is no acute intracranial hemorrhage. No demarcated cortical infarct. No extra-axial fluid collection. No evidence of intracranial mass. No midline shift. Partially empty sella turcica. Vascular: No hyperdense vessel. Skull: Normal. Negative for fracture or focal lesion. Sinuses/Orbits: Visualized orbits show no acute finding. Frothy secretions and severe mucosal thickening within the right maxillary sinus. Scattered frothy secretions and mild mucosal thickening within bilateral ethmoid air cells. Mild right sphenoid sinus mucosal thickening. Small right frontal sinus osteoma. No significant mastoid effusion. IMPRESSION: No CT evidence of acute intracranial abnormality. Partially empty sella turcica. This finding is very commonly incidental, but can be associated with idiopathic intracranial hypertension. Paranasal sinus disease, most notably, ethmoid and right maxillary sinusitis. Electronically Signed   By: Jackey Loge DO   On: 04/07/2020 18:29   MR Brain W and Wo Contrast  Result Date: 04/08/2020 CLINICAL DATA:  Initial evaluation for acute headache. EXAM: MRI HEAD WITHOUT AND WITH CONTRAST MRV HEAD WITHOUT AND WITH  CONTRAST TECHNIQUE: Multiplanar, multiecho pulse sequences of the brain and surrounding structures were obtained without and with intravenous contrast. Angiographic images of the intracranial venous structures were obtained using MRV technique without intravenous contrast. CONTRAST:  28mL GADAVIST GADOBUTROL 1 MMOL/ML IV SOLN COMPARISON:  Prior head CT from 04/07/2020. FINDINGS: MRI HEAD FINDINGS: Brain: Cerebral volume within normal limits for patient age. No focal parenchymal signal abnormality identified. No abnormal foci of restricted diffusion to suggest acute or subacute ischemia. Gray-white matter differentiation well maintained. No encephalomalacia to suggest chronic infarction. No foci of susceptibility artifact to suggest acute or chronic intracranial hemorrhage. No mass lesion, midline shift or mass effect. No hydrocephalus. No extra-axial fluid collection. Major dural sinuses are grossly patent. Pituitary gland and suprasellar region are normal. Midline structures intact and normal. No abnormal enhancement. Vascular: Major intracranial vascular flow voids well maintained and normal in appearance. Skull and upper cervical spine: Craniocervical junction normal. Visualized upper cervical spine within normal limits. Diffusely decreased T1 signal intensity seen throughout the visualized bone marrow, nonspecific,  but most commonly related to anemia, smoking, or obesity. No focal marrow replacing lesion. No scalp soft tissue abnormality. Sinuses/Orbits: Globes and orbital soft tissues within normal limits. Moderate mucosal thickening noted within the ethmoidal air cells, sphenoid sinuses, and maxillary sinuses, right greater than left. Trace right mastoid effusion noted, of doubtful significance. Inner ear structures grossly normal. Other: None. MRV HEAD FINDINGS: Normal flow related signal seen throughout the superior sagittal sinus to the level of the torcula. Torcula itself is patent. Transverse and sigmoid  sinuses are patent as are the visualized proximal internal jugular veins. Straight sinus, vein of Galen, internal cerebral veins, and basal veins of Rosenthal are patent. No evidence for dural sinus thrombosis. No dural sinus stenosis identified. IMPRESSION: 1. Normal brain MRI. No acute intracranial abnormality identified. 2. Moderate paranasal sinus disease, most pronounced within the ethmoidal air cells and maxillary sinuses, right greater than left. 3. Normal intracranial MRV. No evidence for dural sinus thrombosis. Electronically Signed   By: Rise Mu M.D.   On: 04/08/2020 02:20   US Carotid Bilateral (at [facility] and [facility] only)  Result Date: 04/08/2020 CLINICAL DATA:  Stroke.  Left-sided headache.  Double vision. EXAM: BILATERAL CAROTID DUPLEX ULTRASOUND TECHNIQUE: Wallace Cullens scale imaging, color Doppler and duplex ultrasound were performed of bilateral carotid and vertebral arteries in the neck. COMPARISON:  None. FINDINGS: Criteria: Quantification of carotid stenosis is based on velocity parameters that correlate the residual internal carotid diameter with NASCET-based stenosis levels, using the diameter of the distal internal carotid lumen as the denominator for stenosis measurement. The following velocity measurements were obtained: RIGHT ICA: 101/46 cm/sec CCA: 104/29 cm/sec SYSTOLIC ICA/CCA RATIO:  1.0 ECA: 129 cm/sec LEFT ICA: 110/47 cm/sec CCA: 81/28 cm/sec SYSTOLIC ICA/CCA RATIO:  1.4 ECA: 130 cm/sec RIGHT CAROTID ARTERY: There is no grayscale evidence of significant intimal thickening or atherosclerotic plaque affecting the interrogated portions of the right carotid system. There are no elevated peak systolic velocities within the interrogated course of the right internal carotid artery to suggest a hemodynamically significant stenosis. RIGHT VERTEBRAL ARTERY:  Antegrade flow LEFT CAROTID ARTERY: There is a very minimal amount of intimal thickening/atherosclerotic plaque involving the distal  aspect left common carotid artery (image 46). There are no elevated peak systolic velocities within the interrogated course of the left internal carotid artery to suggest a hemodynamically significant stenosis. LEFT VERTEBRAL ARTERY:  Antegrade flow IMPRESSION: 1. Minimal amount of left-sided intimal thickening/atherosclerotic plaque, not resulting in a hemodynamically significant stenosis. 2. Normal sonographic evaluation of the right carotid system. Electronically Signed   By: Simonne Come M.D.   On: 04/08/2020 07:42   MR MRV HEAD W WO CONTRAST  Result Date: 04/08/2020 CLINICAL DATA:  Initial evaluation for acute headache. EXAM: MRI HEAD WITHOUT AND WITH CONTRAST MRV HEAD WITHOUT AND WITH CONTRAST TECHNIQUE: Multiplanar, multiecho pulse sequences of the brain and surrounding structures were obtained without and with intravenous contrast. Angiographic images of the intracranial venous structures were obtained using MRV technique without intravenous contrast. CONTRAST:  46mL GADAVIST GADOBUTROL 1 MMOL/ML IV SOLN COMPARISON:  Prior head CT from 04/07/2020. FINDINGS: MRI HEAD FINDINGS: Brain: Cerebral volume within normal limits for patient age. No focal parenchymal signal abnormality identified. No abnormal foci of restricted diffusion to suggest acute or subacute ischemia. Gray-white matter differentiation well maintained. No encephalomalacia to suggest chronic infarction. No foci of susceptibility artifact to suggest acute or chronic intracranial hemorrhage. No mass lesion, midline shift or mass effect. No hydrocephalus. No extra-axial fluid collection. Major dural  sinuses are grossly patent. Pituitary gland and suprasellar region are normal. Midline structures intact and normal. No abnormal enhancement. Vascular: Major intracranial vascular flow voids well maintained and normal in appearance. Skull and upper cervical spine: Craniocervical junction normal. Visualized upper cervical spine within normal limits.  Diffusely decreased T1 signal intensity seen throughout the visualized bone marrow, nonspecific, but most commonly related to anemia, smoking, or obesity. No focal marrow replacing lesion. No scalp soft tissue abnormality. Sinuses/Orbits: Globes and orbital soft tissues within normal limits. Moderate mucosal thickening noted within the ethmoidal air cells, sphenoid sinuses, and maxillary sinuses, right greater than left. Trace right mastoid effusion noted, of doubtful significance. Inner ear structures grossly normal. Other: None. MRV HEAD FINDINGS: Normal flow related signal seen throughout the superior sagittal sinus to the level of the torcula. Torcula itself is patent. Transverse and sigmoid sinuses are patent as are the visualized proximal internal jugular veins. Straight sinus, vein of Galen, internal cerebral veins, and basal veins of Rosenthal are patent. No evidence for dural sinus thrombosis. No dural sinus stenosis identified. IMPRESSION: 1. Normal brain MRI. No acute intracranial abnormality identified. 2. Moderate paranasal sinus disease, most pronounced within the ethmoidal air cells and maxillary sinuses, right greater than left. 3. Normal intracranial MRV. No evidence for dural sinus thrombosis. Electronically Signed   By: Rise MuBenjamin  McClintock M.D.   On: 04/08/2020 02:20        Scheduled Meds:   stroke: mapping our early stages of recovery book   Does not apply Once   aspirin EC  81 mg Oral Daily   enoxaparin (LOVENOX) injection  40 mg Subcutaneous Q24H   sodium chloride flush  3 mL Intravenous Once   Continuous Infusions:   LOS: 0 days    Time spent: 40 min    Silvano BilisNoah B Quinta Eimer, MD [facility]   If 7PM-7AM, please contact night-coverage [email or web address] Password [email or web address] 04/08/2020, 9:06 AM

## 2020-04-08 NOTE — Discharge Summary (Signed)
Dominique JewettRosa Bryan Bryan ZOX:[id number] DOB: June 23, 1971 DOA: 04/07/2020  PCP: Pcp, No  Admit date: 04/07/2020 Discharge date: 04/08/2020  Time spent: 35 minutes  Recommendations for Outpatient Follow-up:  1. Patient will follow-up at [facility] tomorrow, 9/22, to be seen by Dr. Druscilla BrowniePorfilio 2. Advising establishing with a PCP to f/u headache and anemia   Discharge Diagnoses:  Active Problems:   Headache   Acute focal neurologic deficit with partial resolution   Discharge Condition: good  Diet recommendation: regular  Filed Weights   04/07/20 1729  Weight: 59 kg    History of present illness:  Dominique SettleRosa Bryan Carrillois a 49 y.o.femalewithno significant past medical history and who tries to eat a healthy lifestyle with low-salt and near vegan lifestyle with meditation who was in her usual state of health until 3 days prior when she started experiencing headache in the frontal area waxed and waned and then developed intermittent blurred and double vision worse on the left as well as numbness and tingling of the left arm. The symptoms waxed and waned and she tried resting and meditating to see if it would go away which it did but then returned. By the third day she decided to come in to have it checked out. Associated with the symptoms were nausea and a little dizziness. She denied fever or chills or neck stiffness.States that a month prior her son was hospitalized with an infection, adenovirus. Some of the history is provided by husband at bedside who states that she has been stressed out for the past few months after the passing of her mother, a dog and then recent hospitalization of her son. No history of migraines and rarely has headaches. Overall patient states the symptoms have improved but have not completely gone away. ED Course:While in the ER, vitals were within normal limits, mild anemia of 10.9 but blood work otherwise unremarkable. Head CT showed "partially empty sella  turcica. This finding is very commonly incidental, but can be associated with idiopathic intracranial hypertension.Paranasal sinus disease, most notably, ethmoid and right maxillary sinusitis".Telemetry neurology was consulted from the ER with recommendations for further work-up with MRI, LP if MRI unremarkable and neurologic possible ophthalmological work-up  Hospital Course:  Headache Acute focal neurologic deficit with partial resolution Several days headache initially left of nose then around eye and temple. With left eye blurriness. Says had some tenderness on head as well. HA is resolved but eye remains mildly blurry. Reported paresthesias and subjective weakness left arm and left leg, now resolved. Unremarkable neuro exam today. MRI/MRV and carotid dopplers are negative. Patient reports this morning that she was diagnosed with covid last month. Those symptoms have resolved and covid test is negative. Head CT showing empty sella turcica - neurology consulted, thinks most likely complex migraine - will check sed/crp to further eval for possible GCA. Sed mildly elevated, crp pending at time of discharge  Normocytic anemia - labs suggest component of iron deficiency Denies heavy periods or melena or other bleeding - will need outpatient f/u  Procedures:  none  Consultations:  neurology  Discharge Exam: Vitals:   04/08/20 1338 04/08/20 1339  BP:    Pulse: 71 64  Resp:    Temp:    SpO2: 99% 99%   General exam: Appears calm and comfortable  Respiratory system: Clear to auscultation. Respiratory effort normal. Cardiovascular system: S1 & S2 heard, RRR. No JVD, murmurs, rubs, gallops or clicks. No pedal edema. Gastrointestinal system: Abdomen is nondistended, soft and nontender. No organomegaly  or masses felt. Normal bowel sounds heard. Central nervous system: Alert and oriented. No focal neurological deficits. Extremities: cn 2-12 grossly intact, 5/5 upper and lower strength,  normal finger to nose Skin: No rashes, lesions or ulcers Psychiatry: Judgement and insight appear normal. Mood & affect appropriate.  Discharge Instructions   Discharge Instructions    Call MD for:  difficulty breathing, headache or visual disturbances   Complete by: As directed    Call MD for:  extreme fatigue   Complete by: As directed    Call MD for:  persistant dizziness or light-headedness   Complete by: As directed    Call MD for:  severe uncontrolled pain   Complete by: As directed    Call MD for:  temperature >100.4   Complete by: As directed    Diet general   Complete by: As directed    Increase activity slowly   Complete by: As directed      Allergies as of 04/08/2020   No Known Allergies     Medication List    You have not been prescribed any medications.    No Known Allergies  Follow-up Information    Galen Manila, MD Follow up.   Specialty: Ophthalmology Why: Just go to the clinic tomorrow, September 22nd, any time between 8-12 or 1-4 and Dr. Druscilla Brownie will see you. You can show them this paper (Dr. Druscilla Brownie approved seeing this patient) Contact information: [address] [place] [address] [phone]                The results of significant diagnostics from this hospitalization (including imaging, microbiology, ancillary and laboratory) are listed below for reference.    Significant Diagnostic Studies: CT HEAD WO CONTRAST  Result Date: 04/07/2020 CLINICAL DATA:  Vision loss, monocular. Additional history provided: Patient reports headache to left side of head and face, symptoms began on Saturday, loss of vision in left eye on Sunday but vision returned. EXAM: CT HEAD WITHOUT CONTRAST TECHNIQUE: Contiguous axial images were obtained from the base of the skull through the vertex without intravenous contrast. COMPARISON:  No pertinent prior exams are available for comparison. FINDINGS: Brain: Cerebral volume is normal. There is no  acute intracranial hemorrhage. No demarcated cortical infarct. No extra-axial fluid collection. No evidence of intracranial mass. No midline shift. Partially empty sella turcica. Vascular: No hyperdense vessel. Skull: Normal. Negative for fracture or focal lesion. Sinuses/Orbits: Visualized orbits show no acute finding. Frothy secretions and severe mucosal thickening within the right maxillary sinus. Scattered frothy secretions and mild mucosal thickening within bilateral ethmoid air cells. Mild right sphenoid sinus mucosal thickening. Small right frontal sinus osteoma. No significant mastoid effusion. IMPRESSION: No CT evidence of acute intracranial abnormality. Partially empty sella turcica. This finding is very commonly incidental, but can be associated with idiopathic intracranial hypertension. Paranasal sinus disease, most notably, ethmoid and right maxillary sinusitis. Electronically Signed   By: Jackey Loge DO   On: 04/07/2020 18:29   MR Brain W and Wo Contrast  Result Date: 04/08/2020 CLINICAL DATA:  Initial evaluation for acute headache. EXAM: MRI HEAD WITHOUT AND WITH CONTRAST MRV HEAD WITHOUT AND WITH CONTRAST TECHNIQUE: Multiplanar, multiecho pulse sequences of the brain and surrounding structures were obtained without and with intravenous contrast. Angiographic images of the intracranial venous structures were obtained using MRV technique without intravenous contrast. CONTRAST:  23mL GADAVIST GADOBUTROL 1 MMOL/ML IV SOLN COMPARISON:  Prior head CT from 04/07/2020. FINDINGS: MRI HEAD FINDINGS: Brain: Cerebral volume within normal limits  for patient age. No focal parenchymal signal abnormality identified. No abnormal foci of restricted diffusion to suggest acute or subacute ischemia. Gray-white matter differentiation well maintained. No encephalomalacia to suggest chronic infarction. No foci of susceptibility artifact to suggest acute or chronic intracranial hemorrhage. No mass lesion, midline shift  or mass effect. No hydrocephalus. No extra-axial fluid collection. Major dural sinuses are grossly patent. Pituitary gland and suprasellar region are normal. Midline structures intact and normal. No abnormal enhancement. Vascular: Major intracranial vascular flow voids well maintained and normal in appearance. Skull and upper cervical spine: Craniocervical junction normal. Visualized upper cervical spine within normal limits. Diffusely decreased T1 signal intensity seen throughout the visualized bone marrow, nonspecific, but most commonly related to anemia, smoking, or obesity. No focal marrow replacing lesion. No scalp soft tissue abnormality. Sinuses/Orbits: Globes and orbital soft tissues within normal limits. Moderate mucosal thickening noted within the ethmoidal air cells, sphenoid sinuses, and maxillary sinuses, right greater than left. Trace right mastoid effusion noted, of doubtful significance. Inner ear structures grossly normal. Other: None. MRV HEAD FINDINGS: Normal flow related signal seen throughout the superior sagittal sinus to the level of the torcula. Torcula itself is patent. Transverse and sigmoid sinuses are patent as are the visualized proximal internal jugular veins. Straight sinus, vein of Galen, internal cerebral veins, and basal veins of Rosenthal are patent. No evidence for dural sinus thrombosis. No dural sinus stenosis identified. IMPRESSION: 1. Normal brain MRI. No acute intracranial abnormality identified. 2. Moderate paranasal sinus disease, most pronounced within the ethmoidal air cells and maxillary sinuses, right greater than left. 3. Normal intracranial MRV. No evidence for dural sinus thrombosis. Electronically Signed   By: Rise Mu M.D.   On: 04/08/2020 02:20   US Carotid Bilateral (at [facility] and [facility] only)  Result Date: 04/08/2020 CLINICAL DATA:  Stroke.  Left-sided headache.  Double vision. EXAM: BILATERAL CAROTID DUPLEX ULTRASOUND TECHNIQUE: Wallace Cullens scale imaging,  color Doppler and duplex ultrasound were performed of bilateral carotid and vertebral arteries in the neck. COMPARISON:  None. FINDINGS: Criteria: Quantification of carotid stenosis is based on velocity parameters that correlate the residual internal carotid diameter with NASCET-based stenosis levels, using the diameter of the distal internal carotid lumen as the denominator for stenosis measurement. The following velocity measurements were obtained: RIGHT ICA: 101/46 cm/sec CCA: 104/29 cm/sec SYSTOLIC ICA/CCA RATIO:  1.0 ECA: 129 cm/sec LEFT ICA: 110/47 cm/sec CCA: 81/28 cm/sec SYSTOLIC ICA/CCA RATIO:  1.4 ECA: 130 cm/sec RIGHT CAROTID ARTERY: There is no grayscale evidence of significant intimal thickening or atherosclerotic plaque affecting the interrogated portions of the right carotid system. There are no elevated peak systolic velocities within the interrogated course of the right internal carotid artery to suggest a hemodynamically significant stenosis. RIGHT VERTEBRAL ARTERY:  Antegrade flow LEFT CAROTID ARTERY: There is a very minimal amount of intimal thickening/atherosclerotic plaque involving the distal aspect left common carotid artery (image 46). There are no elevated peak systolic velocities within the interrogated course of the left internal carotid artery to suggest a hemodynamically significant stenosis. LEFT VERTEBRAL ARTERY:  Antegrade flow IMPRESSION: 1. Minimal amount of left-sided intimal thickening/atherosclerotic plaque, not resulting in a hemodynamically significant stenosis. 2. Normal sonographic evaluation of the right carotid system. Electronically Signed   By: Simonne Come M.D.   On: 04/08/2020 07:42   MR MRV HEAD W WO CONTRAST  Result Date: 04/08/2020 CLINICAL DATA:  Initial evaluation for acute headache. EXAM: MRI HEAD WITHOUT AND WITH CONTRAST MRV HEAD WITHOUT AND WITH CONTRAST TECHNIQUE:  Multiplanar, multiecho pulse sequences of the brain and surrounding structures were obtained  without and with intravenous contrast. Angiographic images of the intracranial venous structures were obtained using MRV technique without intravenous contrast. CONTRAST:  63mL GADAVIST GADOBUTROL 1 MMOL/ML IV SOLN COMPARISON:  Prior head CT from 04/07/2020. FINDINGS: MRI HEAD FINDINGS: Brain: Cerebral volume within normal limits for patient age. No focal parenchymal signal abnormality identified. No abnormal foci of restricted diffusion to suggest acute or subacute ischemia. Gray-white matter differentiation well maintained. No encephalomalacia to suggest chronic infarction. No foci of susceptibility artifact to suggest acute or chronic intracranial hemorrhage. No mass lesion, midline shift or mass effect. No hydrocephalus. No extra-axial fluid collection. Major dural sinuses are grossly patent. Pituitary gland and suprasellar region are normal. Midline structures intact and normal. No abnormal enhancement. Vascular: Major intracranial vascular flow voids well maintained and normal in appearance. Skull and upper cervical spine: Craniocervical junction normal. Visualized upper cervical spine within normal limits. Diffusely decreased T1 signal intensity seen throughout the visualized bone marrow, nonspecific, but most commonly related to anemia, smoking, or obesity. No focal marrow replacing lesion. No scalp soft tissue abnormality. Sinuses/Orbits: Globes and orbital soft tissues within normal limits. Moderate mucosal thickening noted within the ethmoidal air cells, sphenoid sinuses, and maxillary sinuses, right greater than left. Trace right mastoid effusion noted, of doubtful significance. Inner ear structures grossly normal. Other: None. MRV HEAD FINDINGS: Normal flow related signal seen throughout the superior sagittal sinus to the level of the torcula. Torcula itself is patent. Transverse and sigmoid sinuses are patent as are the visualized proximal internal jugular veins. Straight sinus, vein of Galen, internal  cerebral veins, and basal veins of Rosenthal are patent. No evidence for dural sinus thrombosis. No dural sinus stenosis identified. IMPRESSION: 1. Normal brain MRI. No acute intracranial abnormality identified. 2. Moderate paranasal sinus disease, most pronounced within the ethmoidal air cells and maxillary sinuses, right greater than left. 3. Normal intracranial MRV. No evidence for dural sinus thrombosis. Electronically Signed   By: Rise Mu M.D.   On: 04/08/2020 02:20    Microbiology: Recent Results (from the past 240 hour(s))  SARS Coronavirus 2 by RT PCR (hospital order, performed in [facility] hospital lab) Nasopharyngeal Nasopharyngeal Swab     Status: None   Collection Time: 04/07/20 10:49 PM   Specimen: Nasopharyngeal Swab  Result Value Ref Range Status   SARS Coronavirus 2 NEGATIVE NEGATIVE Final    Comment: (NOTE) SARS-CoV-2 target nucleic acids are NOT DETECTED.  The SARS-CoV-2 RNA is generally detectable in upper and lower respiratory specimens during the acute phase of infection. The lowest concentration of SARS-CoV-2 viral copies this assay can detect is 250 copies / mL. A negative result does not preclude SARS-CoV-2 infection and should not be used as the sole basis for treatment or other patient management decisions.  A negative result may occur with improper specimen collection / handling, submission of specimen other than nasopharyngeal swab, presence of viral mutation(s) within the areas targeted by this assay, and inadequate number of viral copies (<250 copies / mL). A negative result must be combined with clinical observations, patient history, and epidemiological information.  Fact Sheet for Patients:   BoilerBrush.com.cy  Fact Sheet for Healthcare Providers: https://pope.com/  This test is not yet approved or  cleared by the [place] FDA and has been authorized for detection and/or diagnosis of  SARS-CoV-2 by FDA under an Emergency Use Authorization (EUA).  This EUA will remain in effect (meaning this test  can be used) for the duration of the COVID-19 declaration under Section 564(b)(1) of the Act, 21 U.S.C. section 360bbb-3(b)(1), unless the authorization is terminated or revoked sooner.  Performed at [facility], [address] [place], [place] [address]      Labs: Basic Metabolic Panel: Recent Labs  Lab 04/07/20 1739  NA 137  K 3.8  CL 105  CO2 24  GLUCOSE 99  BUN 11  CREATININE 0.64  CALCIUM 8.6*   Liver Function Tests: Recent Labs  Lab 04/07/20 1739  AST 23  ALT 21  ALKPHOS 74  BILITOT 0.5  PROT 7.7  ALBUMIN 4.0   No results for input(s): LIPASE, AMYLASE in the last 168 hours. No results for input(s): AMMONIA in the last 168 hours. CBC: Recent Labs  Lab 04/07/20 1739 04/08/20 1236  WBC 9.2 7.0  NEUTROABS 6.8  --   HGB 10.9* 10.0*  HCT 33.8* 30.6*  MCV 86.2 86.0  PLT 307 265   Cardiac Enzymes: No results for input(s): CKTOTAL, CKMB, CKMBINDEX, TROPONINI in the last 168 hours. BNP: BNP (last 3 results) No results for input(s): BNP in the last 8760 hours.  ProBNP (last 3 results) No results for input(s): PROBNP in the last 8760 hours.  CBG: No results for input(s): GLUCAP in the last 168 hours.     Signed:  Silvano Bilis MD.  [facility] 04/08/2020, 2:34 PM

## 2020-04-08 NOTE — Progress Notes (Signed)
*  PRELIMINARY RESULTS* Echocardiogram 2D Echocardiogram has been performed.  Dominique Bryan 04/08/2020, 11:11 AM

## 2020-04-08 NOTE — Progress Notes (Signed)
OT Cancellation Note  Patient Details Name: Dominique Bryan MRN: [id number] DOB: 02/18/1971   Cancelled Treatment:    Reason Eval/Treat Not Completed: OT screened, no needs identified, will sign off. Order received and chart reviewed. Spoke with primary RN who indicates pt is resting comfortably, reporting all symptoms have resolved. She is independent with bathroom management and self feeding. Per physical therapist, pt endorsed minimal L arm numbness. Recommend, OP OT referral if issue is ongoing and impact's pt ability to perform ADL/Self care management upon return home. No further skilled acute OT needs identified. Will sign off at this time. Please re-consult if additional OT needs arise during this admission.    Rockney Ghee, M.S., OTR/L Ascom: [phone] 04/08/20, 1:31 PM

## 2020-04-08 NOTE — ED Notes (Addendum)
Pt transferred to US. 

## 2020-04-09 LAB — ECHOCARDIOGRAM COMPLETE
AR max vel: 2.28 cm2
AV Area VTI: 2.41 cm2
AV Area mean vel: 2.52 cm2
AV Mean grad: 3 mmHg
AV Peak grad: 5.3 mmHg
Ao pk vel: 1.15 m/s
Area-P 1/2: 3.43 cm2
Height: 62.205 in
S' Lateral: 2.44 cm
Weight: 2080 oz

## 2020-05-13 ENCOUNTER — Emergency Department
Admission: EM | Admit: 2020-05-13 | Discharge: 2020-05-13 | Disposition: A | Discharge status: Home / Self Care | Payer: BC Managed Care – PPO | Attending: Emergency Medicine | Admitting: Emergency Medicine

## 2020-05-13 ENCOUNTER — Encounter: Payer: Self-pay | Admitting: Emergency Medicine

## 2020-05-13 ENCOUNTER — Other Ambulatory Visit: Payer: Self-pay

## 2020-05-13 ENCOUNTER — Encounter: Payer: Self-pay | Admitting: *Deleted

## 2020-05-13 DIAGNOSIS — N6452 Nipple discharge: Secondary | ICD-10-CM | POA: Diagnosis not present

## 2020-05-13 DIAGNOSIS — R42 Dizziness and giddiness: Secondary | ICD-10-CM | POA: Insufficient documentation

## 2020-05-13 MED ORDER — MECLIZINE HCL 25 MG PO TABS: 25.0000 mg | ORAL_TABLET | Freq: Three times a day (TID) | ORAL | 0 refills | Status: AC | PRN

## 2020-05-13 NOTE — ED Triage Notes (Signed)
Pt ambulatory to triage.  Pt reports bleeding from nipple today.  Pt reports pain in breast.  Sx began today.

## 2020-05-13 NOTE — ED Notes (Signed)
Pt alert and oriented X 4, stable for discharge. RR even and unlabored, color WNL. Discussed discharge instructions and follow-up as directed. Discharge medications discussed if provided. Pt had opportunity to ask questions if necessary and RN to provide patient/family eduction. Interpreter via ipad used for discharge.

## 2020-05-13 NOTE — Discharge Instructions (Signed)
1.  You may take Meclizine as needed for dizziness. 2.  Please call the number above to schedule your breast ultrasound.  Please also call to establish primary care provider. 3.  Return to the ER for worsening symptoms, persistent vomiting, difficulty breathing or other concerns.

## 2020-05-13 NOTE — ED Provider Notes (Signed)
[facility] Emergency Department Provider Note   ____________________________________________   First MD Initiated Contact with Patient 05/13/20 [phone]     (approximate)  I have reviewed the triage vital signs and the nursing notes.   HISTORY  Chief Complaint Breast Discharge  History obtained via tele-Spanish interpreter  HPI Dominique Bryan is a 49 y.o. female who presents to the ED from home with a chief complaint of right breast discharge.  Patient reports a history of fibrocystic breasts.  Seen at [facility] clinic in 08/2019 for right galactorrhea and breast cyst; placed on NSAIDs and had a breast ultrasound which she reports as "inflammation".  Notes right breast becoming larger and last night in the shower she squeezed her breast and noted bloody discharge.  More recently patient was admitted 04/08/2020 for headache, blurred and double vision and admitted for stroke work-up with negative MRI/MRV and carotid Dopplers.  She was seen by neurology who thought patient was experiencing a complex migraine.  She was referred to [facility] health services for PCP referral which she has not yet made an appointment.  Patient notes she has intermittent vertigo without nausea or vomiting.  Denies fever, cough, headache, chest pain, shortness of breath, abdominal pain, nausea, vomiting.  Denies unintentional weight loss.      Past medical history Fibrocystic breast  Patient Active Problem List   Diagnosis Date Noted  . Acute focal neurologic deficit with partial resolution 04/08/2020  . Headache 04/07/2020    No past surgical history on file.  Prior to Admission medications   Medication Sig Start Date End Date Taking? Authorizing Provider  meclizine (ANTIVERT) 25 MG tablet Take 1 tablet (25 mg total) by mouth 3 (three) times daily as needed for dizziness or nausea. 05/13/20   Irean Hong, MD    Allergies Patient has no known allergies.  Family History    Problem Relation Age of Onset  . Diabetes Mother   . Prostate cancer Father   None for breast cancer  Social History Social History   Tobacco Use  . Smoking status: Never Smoker  . Smokeless tobacco: Never Used  Substance Use Topics  . Alcohol use: Not Currently  . Drug use: Not Currently    Review of Systems  Constitutional: No fever/chills Eyes: No visual changes. ENT: No sore throat. Cardiovascular: Denies chest pain. Respiratory: Denies shortness of breath. Breasts: Bloody discharge from right nipple. Gastrointestinal: No abdominal pain.  No nausea, no vomiting.  No diarrhea.  No constipation. Genitourinary: Negative for dysuria. Musculoskeletal: Negative for back pain. Skin: Negative for rash. Neurological: Positive for occasional dizziness.  Negative for headaches, focal weakness or numbness.   ____________________________________________   PHYSICAL EXAM:  VITAL SIGNS: ED Triage Vitals [05/13/20 0021]  Enc Vitals Group     BP (!) 181/96     Pulse Rate 100     Resp 20     Temp 98.8 F (37.1 C)     Temp Source Oral     SpO2 100 %     Weight 135 lb (61.2 kg)     Height 5\' 2"  (1.575 m)     Head Circumference      Peak Flow      Pain Score 8     Pain Loc      Pain Edu?      Excl. in GC?     Constitutional: Alert and oriented. Well appearing and in no acute distress. Eyes: Conjunctivae are normal. PERRL. EOMI. Head:  Atraumatic. Ears: Slight fluid behind bilateral TMs, otherwise unremarkable. Nose: No congestion/rhinnorhea. Mouth/Throat: Mucous membranes are moist.  Oropharynx non-erythematous. Neck: No stridor.   Cardiovascular: Normal rate, regular rhythm. Grossly normal heart sounds.  Good peripheral circulation. Respiratory: Normal respiratory effort.  No retractions. Lungs CTAB. Breasts: Right breast does appear mildly larger than left.  No peau d'orange.  Freely mobile cyst noted to right breast.  No nipple discharge.  No lymphadenopathy.  Left  breast and lymph exam unremarkable. Gastrointestinal: Soft and nontender. No distention. No abdominal bruits. No CVA tenderness. Musculoskeletal: No lower extremity tenderness nor edema.  No joint effusions. Neurologic:  Normal speech and language. No gross focal neurologic deficits are appreciated. No gait instability. Skin:  Skin is warm, dry and intact. No rash noted. Psychiatric: Mood and affect are normal. Speech and behavior are normal.  ____________________________________________   LABS (all labs ordered are listed, but only abnormal results are displayed)  Labs Reviewed - No data to display ____________________________________________  EKG  None ____________________________________________  RADIOLOGY I, Pastor Sgro J, personally viewed and evaluated these images (plain radiographs) as part of my medical decision making, as well as reviewing the written report by the radiologist.  ED MD interpretation: None  Official radiology report(s): No results found.  ____________________________________________   PROCEDURES  Procedure(s) performed (including Critical Care):  Procedures   ____________________________________________   INITIAL IMPRESSION / ASSESSMENT AND PLAN / ED COURSE  As part of my medical decision making, I reviewed the following data within the electronic MEDICAL RECORD NUMBER Nursing notes reviewed and incorporated, Interpreter needed, Old chart reviewed and Notes from prior ED visits     49 year old female presenting with right breast discharge.  I have personally reviewed patient's chart from 2017 noting fibrocystic breast disease.  Also reviewed patient's clinic visit from 08/2019 for right galactorrhea.  Have also reviewed hospitalization records from 04/08/2020 and reviewed MRI results demonstrating negative MRI/MRA, normal pituitary, moderate paranasal sinus disease.  Will prescribe meclizine for occasional dizziness.  Will provide contact information for  [facility] health services for patient to establish primary care.  I explained to the patient that I am unable to obtain breast imaging overnight.  She will be referred to the [facility] breast imaging center for right breast ultrasound.  Patient notes she has had normal mammograms since before the pandemic but has not had one since the pandemic began.  I did encourage patient to schedule a mammogram.  Spent > 20 minutes discussing the above with the patient using Spanish interpreter.  Strict return precautions given.  Patient verbalizes understanding agrees with plan of care.      ____________________________________________   FINAL CLINICAL IMPRESSION(S) / ED DIAGNOSES  Final diagnoses:  Breast discharge  Vertigo     ED Discharge Orders         Ordered    meclizine (ANTIVERT) 25 MG tablet  3 times daily PRN        05/13/20 0431          *Please note:  Dominique Bryan was evaluated in Emergency Department on 05/13/2020 for the symptoms described in the history of present illness. She was evaluated in the context of the global COVID-19 pandemic, which necessitated consideration that the patient might be at risk for infection with the SARS-CoV-2 virus that causes COVID-19. Institutional protocols and algorithms that pertain to the evaluation of patients at risk for COVID-19 are in a state of rapid change based on information released by regulatory bodies including the CDC and  federal and state organizations. These policies and algorithms were followed during the patient's care in the ED.  Some ED evaluations and interventions may be delayed as a result of limited staffing during and the pandemic.*   Note:  This document was prepared using Dragon voice recognition software and may include unintentional dictation errors.   Irean Hong, MD 05/13/20 [phone]

## 2020-05-20 ENCOUNTER — Other Ambulatory Visit: Payer: Self-pay | Admitting: Student

## 2020-05-20 DIAGNOSIS — N6452 Nipple discharge: Secondary | ICD-10-CM

## 2020-05-20 DIAGNOSIS — Z1231 Encounter for screening mammogram for malignant neoplasm of breast: Secondary | ICD-10-CM

## 2020-05-22 ENCOUNTER — Inpatient Hospital Stay
Admission: RE | Admit: 2020-05-22 | Discharge: 2020-05-22 | Disposition: A | Discharge status: Home / Self Care | Payer: Self-pay | Source: Ambulatory Visit | Attending: *Deleted | Admitting: *Deleted

## 2020-05-22 ENCOUNTER — Other Ambulatory Visit: Payer: Self-pay | Admitting: *Deleted

## 2020-05-22 DIAGNOSIS — Z1231 Encounter for screening mammogram for malignant neoplasm of breast: Secondary | ICD-10-CM

## 2020-06-04 ENCOUNTER — Other Ambulatory Visit: Payer: Managed Care, Other (non HMO)

## 2020-06-04 ENCOUNTER — Inpatient Hospital Stay: Admission: RE | Admit: 2020-06-04 | Payer: Managed Care, Other (non HMO) | Source: Ambulatory Visit

## 2020-06-17 ENCOUNTER — Ambulatory Visit
Admission: RE | Admit: 2020-06-17 | Discharge: 2020-06-17 | Disposition: A | Discharge status: Home / Self Care | Payer: BC Managed Care – PPO | Source: Ambulatory Visit | Attending: Obstetrics and Gynecology | Admitting: Obstetrics and Gynecology

## 2020-06-17 ENCOUNTER — Other Ambulatory Visit: Payer: Self-pay

## 2020-06-17 DIAGNOSIS — Z1231 Encounter for screening mammogram for malignant neoplasm of breast: Secondary | ICD-10-CM

## 2020-06-17 DIAGNOSIS — N6452 Nipple discharge: Secondary | ICD-10-CM | POA: Diagnosis not present

## 2020-06-30 ENCOUNTER — Other Ambulatory Visit: Payer: Self-pay | Admitting: Obstetrics and Gynecology

## 2020-06-30 DIAGNOSIS — R921 Mammographic calcification found on diagnostic imaging of breast: Secondary | ICD-10-CM

## 2020-06-30 DIAGNOSIS — R928 Other abnormal and inconclusive findings on diagnostic imaging of breast: Secondary | ICD-10-CM

## 2021-03-26 ENCOUNTER — Other Ambulatory Visit: Payer: Self-pay | Admitting: Student

## 2021-05-01 ENCOUNTER — Other Ambulatory Visit: Payer: Self-pay | Admitting: Student

## 2021-05-01 DIAGNOSIS — Z1231 Encounter for screening mammogram for malignant neoplasm of breast: Secondary | ICD-10-CM

## 2021-05-01 DIAGNOSIS — R928 Other abnormal and inconclusive findings on diagnostic imaging of breast: Secondary | ICD-10-CM

## 2021-06-29 ENCOUNTER — Emergency Department: Payer: BC Managed Care – PPO

## 2021-06-29 ENCOUNTER — Other Ambulatory Visit: Payer: Self-pay

## 2021-06-29 ENCOUNTER — Encounter: Payer: Self-pay | Admitting: *Deleted

## 2021-06-29 DIAGNOSIS — R0789 Other chest pain: Secondary | ICD-10-CM | POA: Diagnosis present

## 2021-06-29 LAB — CBC
HCT: 38.3 % (ref 36.0–46.0)
Hemoglobin: 12.3 g/dL (ref 12.0–15.0)
MCH: 27.6 pg (ref 26.0–34.0)
MCHC: 32.1 g/dL (ref 30.0–36.0)
MCV: 86.1 fL (ref 80.0–100.0)
Platelets: 258 10*3/uL (ref 150–400)
RBC: 4.45 MIL/uL (ref 3.87–5.11)
RDW: 14.3 % (ref 11.5–15.5)
WBC: 11.7 10*3/uL — ABNORMAL HIGH (ref 4.0–10.5)
nRBC: 0 % (ref 0.0–0.2)

## 2021-06-29 NOTE — ED Triage Notes (Signed)
Pt has chest pain for 30 minutes.  Pt has numbness in arms.  Pt also reports no sob.  Non smoker.  Interpreter on a stick used in triage.   Pt alert  speech clear.

## 2021-06-30 ENCOUNTER — Emergency Department
Admission: EM | Admit: 2021-06-30 | Discharge: 2021-06-30 | Disposition: A | Discharge status: Home / Self Care | Payer: BC Managed Care – PPO | Attending: Emergency Medicine | Admitting: Emergency Medicine

## 2021-06-30 DIAGNOSIS — R0789 Other chest pain: Secondary | ICD-10-CM

## 2021-06-30 LAB — BASIC METABOLIC PANEL
Anion gap: 5 (ref 5–15)
BUN: 12 mg/dL (ref 6–20)
CO2: 27 mmol/L (ref 22–32)
Calcium: 9.4 mg/dL (ref 8.9–10.3)
Chloride: 108 mmol/L (ref 98–111)
Creatinine, Ser: 0.81 mg/dL (ref 0.44–1.00)
GFR, Estimated: >60 mL/min (ref >60)
Glucose, Bld: 118 mg/dL — ABNORMAL HIGH (ref 70–99)
Potassium: 4 mmol/L (ref 3.5–5.1)
Sodium: 140 mmol/L (ref 135–145)

## 2021-06-30 LAB — TROPONIN I (HIGH SENSITIVITY)
Troponin I (High Sensitivity): 3 ng/L (ref <18)
Troponin I (High Sensitivity): 4 ng/L (ref <18)

## 2021-06-30 NOTE — ED Provider Notes (Signed)
[facility] Emergency Department Provider Note  ____________________________________________  Time seen: Approximately 8:05 AM  I have reviewed the triage vital signs and the nursing notes.   HISTORY  Chief Complaint No chief complaint on file.    HPI Dominique Bryan is a 50 y.o. female with no significant past medical history who comes ED complaining of central chest pain described as tightness which started at 11:00 PM last night, with tingling in both arms.  No shortness of breath diaphoresis or vomiting.  Nonradiating.  Lasted for 15 minutes and then resolved.  At the time of onset, she was at a friend's funeral who had died suddenly of a heart attack, and it was very emotional.  Additionally, the gathering was being held outside on the patio, and the air temperature was very cold.  She did feel like she was shivering as well.  Since resolving at about 11:15 PM last night, the pain has not reoccurred  She is otherwise been in her usual state of health except for being exposed to someone with COVID 3 weeks ago and subsequently experiencing several days of headache and fatigue.      No past medical history on file.   Patient Active Problem List   Diagnosis Date Noted   Acute focal neurologic deficit with partial resolution 04/08/2020   Headache 04/07/2020     No past surgical history on file.   Prior to Admission medications   Medication Sig Start Date End Date Taking? Authorizing Provider  meclizine (ANTIVERT) 25 MG tablet Take 1 tablet (25 mg total) by mouth 3 (three) times daily as needed for dizziness or nausea. 05/13/20   Irean Hong, MD     Allergies Patient has no known allergies.   Family History  Problem Relation Age of Onset   Diabetes Mother    Prostate cancer Father     Social History Social History   Tobacco Use   Smoking status: Never   Smokeless tobacco: Never  Substance Use Topics   Alcohol use: Not  Currently   Drug use: Not Currently    Review of Systems  Constitutional:   No fever or chills.  ENT:   No sore throat. No rhinorrhea. Cardiovascular: Positive chest pain as above without  syncope. Respiratory:   No dyspnea or cough. Gastrointestinal:   Negative for abdominal pain, vomiting and diarrhea.  Musculoskeletal:   Negative for focal pain or swelling All other systems reviewed and are negative except as documented above in ROS and HPI.  ____________________________________________   PHYSICAL EXAM:  VITAL SIGNS: ED Triage Vitals  Enc Vitals Group     BP 06/29/21 2340 (!) 174/81     Pulse Rate 06/29/21 2340 84     Resp 06/29/21 2340 18     Temp 06/29/21 2340 98.6 F (37 C)     Temp Source 06/29/21 2340 Oral     SpO2 06/29/21 2340 98 %     Weight 06/29/21 2341 135 lb (61.2 kg)     Height 06/29/21 2341 5\' 2"  (1.575 m)     Head Circumference --      Peak Flow --      Pain Score 06/29/21 2341 8     Pain Loc --      Pain Edu? --      Excl. in GC? --     Vital signs reviewed, nursing assessments reviewed.   Constitutional:   Alert and oriented. Non-toxic appearance. Eyes:   Conjunctivae  are normal. EOMI. PERRL. ENT      Head:   Normocephalic and atraumatic.      Nose:   Wearing a mask.      Mouth/Throat:   Wearing a mask.      Neck:   No meningismus. Full ROM. Hematological/Lymphatic/Immunilogical:   No cervical lymphadenopathy. Cardiovascular:   RRR. Symmetric bilateral radial and DP pulses.  No murmurs. Cap refill less than 2 seconds. Respiratory:   Normal respiratory effort without tachypnea/retractions. Breath sounds are clear and equal bilaterally. No wheezes/rales/rhonchi.  Musculoskeletal:   Normal range of motion in all extremities. No joint effusions.  No lower extremity tenderness.  No edema. Neurologic:   Normal speech and language.  Motor grossly intact. No acute focal neurologic deficits are appreciated.  Skin:    Skin is warm, dry and intact.  No rash noted.  No petechiae, purpura, or bullae.  ____________________________________________    LABS (pertinent positives/negatives) (all labs ordered are listed, but only abnormal results are displayed) Labs Reviewed  BASIC METABOLIC PANEL - Abnormal; Notable for the following components:      Result Value   Glucose, Bld 118 (*)    All other components within normal limits  CBC - Abnormal; Notable for the following components:   WBC 11.7 (*)    All other components within normal limits  POC URINE PREG, ED  TROPONIN I (HIGH SENSITIVITY)  TROPONIN I (HIGH SENSITIVITY)   ____________________________________________   EKG  Interpreted by me Normal sinus rhythm rate of 81, normal axis intervals QRS ST segments and T waves.  ____________________________________________    RADIOLOGY  DG Chest 2 View  Result Date: 06/30/2021 CLINICAL DATA:  Chest pain EXAM: CHEST - 2 VIEW COMPARISON:  None. FINDINGS: Lungs are clear.  No pleural effusion or pneumothorax. The heart is normal in size. Visualized osseous structures are within normal limits. IMPRESSION: Normal chest radiographs. Electronically Signed   By: Charline Bills M.D.   On: 06/30/2021 00:12    ____________________________________________   PROCEDURES Procedures  ____________________________________________    CLINICAL IMPRESSION / ASSESSMENT AND PLAN / ED COURSE  Medications ordered in the ED: Medications - No data to display  Pertinent labs & imaging results that were available during my care of the patient were reviewed by me and considered in my medical decision making (see chart for details).  Dominique Bryan was evaluated in Emergency Department on 06/30/2021 for the symptoms described in the history of present illness. She was evaluated in the context of the global COVID-19 pandemic, which necessitated consideration that the patient might be at risk for infection with the SARS-CoV-2 virus that  causes COVID-19. Institutional protocols and algorithms that pertain to the evaluation of patients at risk for COVID-19 are in a state of rapid change based on information released by regulatory bodies including the CDC and federal and state organizations. These policies and algorithms were followed during the patient's care in the ED.   Patient presents with atypical chest pain. Considering the patient's symptoms, medical history, and physical examination today, I have low suspicion for ACS, PE, TAD, pneumothorax, carditis, mediastinitis, pneumonia, CHF, or sepsis.  Exam, vitals, EKG, chest x-ray, labs all unremarkable.  Most likely this is stress-induced or a reaction to prolonged period of time in a cold environment.  There is a possibility of paroxysmal dysrhythmia such as SVT or A. fib, and I recommend she follows up with cardiology if symptoms recur for possible Holter monitoring.  ____________________________________________   FINAL CLINICAL IMPRESSION(S) / ED DIAGNOSES    Final diagnoses:  Atypical chest pain     ED Discharge Orders     None       Portions of this note were generated with dragon dictation software. Dictation errors may occur despite best attempts at proofreading.    Sharman Cheek, MD 06/30/21 [phone]

## 2021-06-30 NOTE — Discharge Instructions (Signed)
Your EKG, chest x-ray, and lab tests are all normal today.  If symptoms happen again, please call the cardiology clinic to schedule a follow-up visit.

## 2021-07-01 ENCOUNTER — Telehealth: Payer: Self-pay

## 2021-07-01 NOTE — Telephone Encounter (Signed)
Called patient to schedule ED follow up

## 2023-03-15 NOTE — Discharge Summary (Signed)
 Discharge Summary   Patient Name: Dominique Bryan Patient MRN: [id number] Date of Birth: 1970-09-26    Facility:[facility]  Attending Physician: Deane Pansy Schlatter, MD   Date of Admission: 03/14/2023  Date of Discharge: 03/17/2023    Admission Diagnosis: Malignant neoplasm of overlapping sites of right breast in female, estrogen receptor positive (CMS/HHS-HCC) [C50.811, Z17.0]  Principal Discharge Diagnosis: Malignant neoplasm of overlapping sites of right breast in female, estrogen receptor positive (CMS/HHS-HCC) [C50.811, Z17.0]  Consult Orders: None   Secondary Diagnoses:  Patient Active Problem List  Diagnosis  . Malignant neoplasm of right breast in female, estrogen receptor positive (CMS/HHS-HCC)    Surgeries Performed: Procedure(s): BREAST RECONSTRUCTION; WITH FREE FLAP (EG, FTRAM, DIEP, SIEA, GAP FLAP) INTRAVENOUS INJECTION OF AGENT (EG, FLUORESCEIN) TO TEST VASCULAR FLOW IN FLAP OR GRAFT MASTECTOMY, SIMPLE, COMPLETE  Allergies: No Known Allergies   Discharge Physical Exam:  Temp (24hrs), Avg:36.7 C (98.1 F), Min:36.6 C (97.8 F), Max:36.8 C (98.3 F)  Temp:  [36.6 C (97.8 F)-36.8 C (98.3 F)] 36.8 C (98.3 F) Heart Rate:  [72-99] 99 Resp:  [16-19] 19 BP: (71-99)/(29-54) 91/48  Drains:   Output by Drain (mL) 03/14/23 0701 - 03/14/23 1900 03/14/23 1901 - 03/15/23 0700 03/15/23 0701 - 03/15/23 1900 03/15/23 1901 - 03/16/23 0700 03/16/23 0701 - 03/16/23 1023  Closed/Suction Drain Blake 3 Lateral;Right;Lower Abdomen 19 Fr.  45 100 50 45  Closed/Suction Drain Blake 4 Lateral;Left;Lower Abdomen 19 Fr.  45 30 20 30   Closed/Suction Drain Government social research officer;Lower Breast 15 Fr.  120 80 60 30  Closed/Suction Drain Blake 1 Right;Lower;Lateral Breast 15 Fr.  40 20 30 25   Negative Pressure Wound Therapy Abdomen  0 0 0      Gen: NAD Breast: RIGHT breast inc c/d/i, soft, no hematoma, flaps warm with good cap refill, viable.  Vioptix (unit  [id number]): ch1 75%, 93SQ; discontinued at the time of discharge Doppler: strong, biphasic arterial signal, strong venous signal; discontinued at the time of discharge  Abd: soft, appropriately tender to palpation, ND, inc c/d/i, no hematoma, umbo viable, prevena in place holding good suction.  Drains: serosanguinous x4  Brief Hospital Course: The patient was taken to the operating room on 03/14/2023, and underwent a Procedure(s): RIGHT breast completion mastectomy and reconstruction with unilateral DIEP free flap without difficulty. The patient tolerated the procedure well, was extubated, and taken to the recovery room in stable condition.   Post-operatively, her hemoglobin was noted to be 6.6, which was treated with a transfusion of one unit packed red blood cells on 8/28. A repeat CBC confirmed that her hemoglobin had returned to above the transfusion threshold (now 8.2). The postoperative course was otherwise as expected without significant complications. Additional laboratory studies remained within normal limits and electrolytes were supplemented as needed. Vioptix signals remained normal, and doppler signals remained strong and were discontinued at time of discharge. Breast and abdominal incisions remained c/d/I. Drain output was as expected with normal serosanguinous output. Patient was able to ambulate and urinate normally. Patient was tolerating normal diet. Pain was well controlled on PO medications. The patient was found to meet all expected discharge criteria, and was discharged on 03/17/2023.   Discharge Disposition:  Home  Medications at Discharge:   Discharge Medications     New Medications      Details  docusate 100 MG capsule Commonly known as: COLACE  100 mg, Oral, 2 times Daily Quantity: 20 capsule Refills: 0   gabapentin 100 MG capsule Commonly known  as: NEURONTIN  100 mg, Oral, 3 times Daily Quantity: 21 capsule Refills: 0   magnesium  hydroxide 400 mg/5 mL  suspension Commonly known as: MILK OF MAGNESIA  30 mLs, Oral, Nightly PRN Quantity: 300 mL Refills: 0   naproxen 500 MG tablet Commonly known as: NAPROSYN  500 mg, Oral, 2 times Daily with meals Quantity: 14 tablet Refills: 0   ondansetron  4 MG disintegrating tablet Commonly known as: ZOFRAN -ODT  4 mg, Oral, Every 8 hours PRN Quantity: 10 tablet Refills: 0   oxyCODONE 5 MG immediate release tablet Commonly known as: ROXICODONE  5 mg, Oral, Every 6 hours PRN Quantity: 20 tablet Refills: 0       Modified Medications      Details  acetaminophen  500 MG tablet Commonly known as: TYLENOL  What changed:  how much to take when to take this reasons to take this  500 mg, Oral, Every 6 hours Refills: 0       Medications To Continue      Details  tamoxifen 20 MG tablet Commonly known as: NOLVADEX  20 mg, Oral, Daily Quantity: 90 tablet Refills: 3        Patient Instructions:  Do not soak in a tub or swim for 4 weeks. You may shower in 48 hours, but do not rub incisions. Allow soap and water to run over breasts and abdomen and gently pat dry Do not lift your arms above 90 degrees (above your shoulders) until cleared by your surgeon Stay in a beach chair position until instructed to do otherwise Do not lift anything over 10 lbs or do any repetitive bending or squatting for 3 weeks. Do not drive while taking pain medication. Please empty and record your drain output three times a day.  If we have changed your medications, please see your primary care physician within 10 days of discharge to discuss these medication changes. Please follow the remainder of the specific discharge instructions included in your AVS  If you have any questions or concerns DURING BUSINESS HOURS (9am-4pm) PLEASE CALL [phone].  Outside of business hours or weekends PLEASE CONTACT PLASTIC SURGERY RESIDENT ON CALL AT [phone] IF: Temperature greater than 100.4 Persistent nausea and  vomiting Severe uncontrolled pain Changes in color of the breast flap (severe redness, or severe paleness) Redness, tenderness, or signs of infection (pain, swelling, redness, odor or green/yellow discharge around the site) Difficulty breathing, headache or visual disturbances Hives Persistent dizziness or light-headedness Extreme fatigue Any other questions or concerns you may have after discharge  In an emergency, call 911   It is important to bring a complete, current list of your medications to any medical appointments or hospitalizations.  Follow-up Recommended:  Please keep your follow up appointments as scheduled below    [facility] Appointments: Future Appointments  Date Time Provider Department Center  03/25/2023  8:30 AM Jeri Donald Ruth, [place] [facility]  03/28/2023  9:30 AM Aden Leita Barlow, MD [facility]     Primary Care Physician Center, [facility] Address: [address] [place] / [place] [address]  Phone: [phone] Fax: [phone]    Outside Providers, for pending tests please use the following [facility] numbers:  Laboratory - Please Call: [phone] Microbiology - Please Call: [phone] Pathology - Please Call: [phone]  Outside Providers, with questions related to your patient's hospitalization, please contact the attending of record, Sisk, Pansy Schlatter, MD.  Signed,  Evalene Petties [facility] 03/16/2023    Time spent on this discharge: 15 minutes

## 2023-11-19 IMAGING — CR DG CHEST 2V
2 series · 2 of 2 positions shown · non-contrast
Comparison: None.

CLINICAL DATA: Chest pain

EXAM:
CHEST - 2 VIEW

[chest pa]
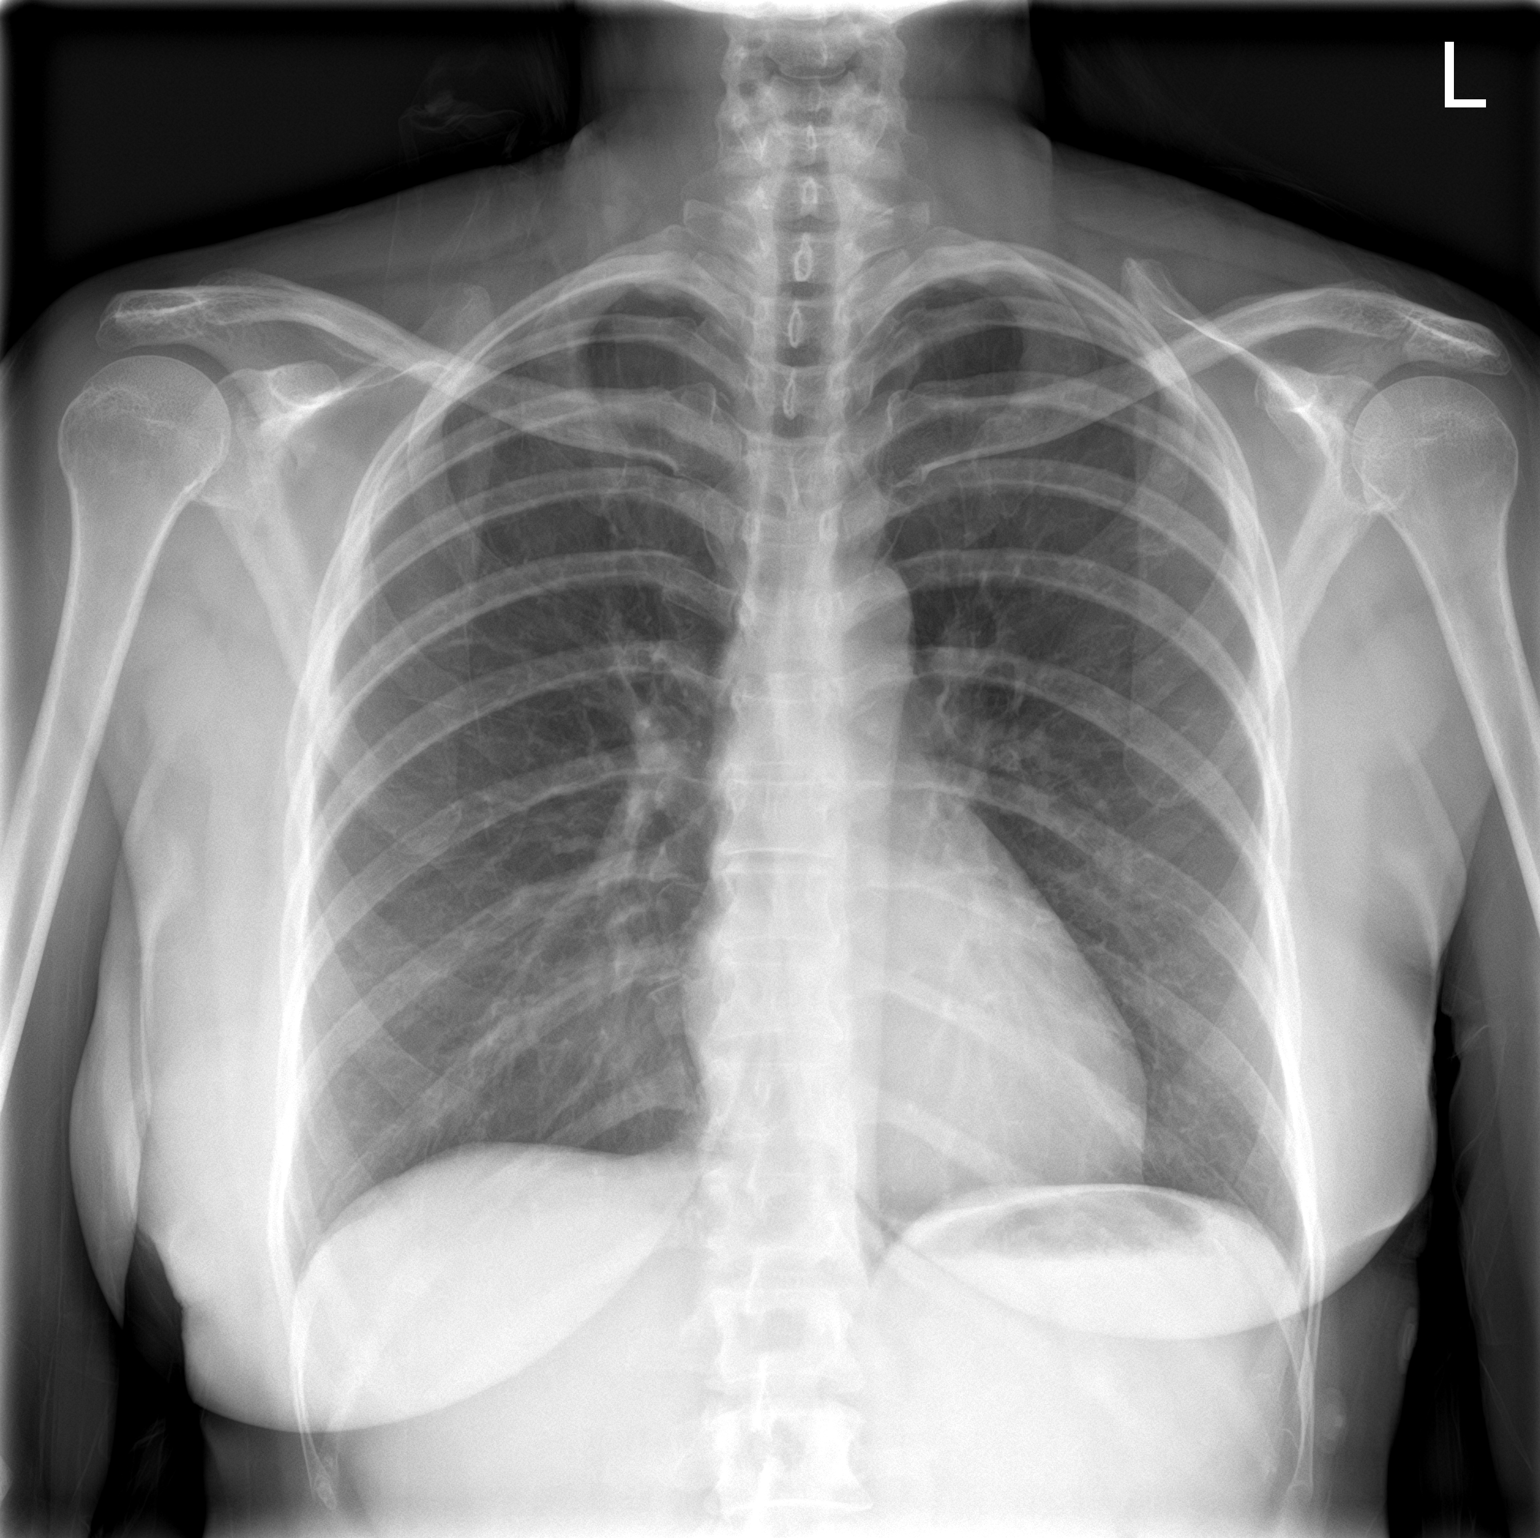

[chest lat]
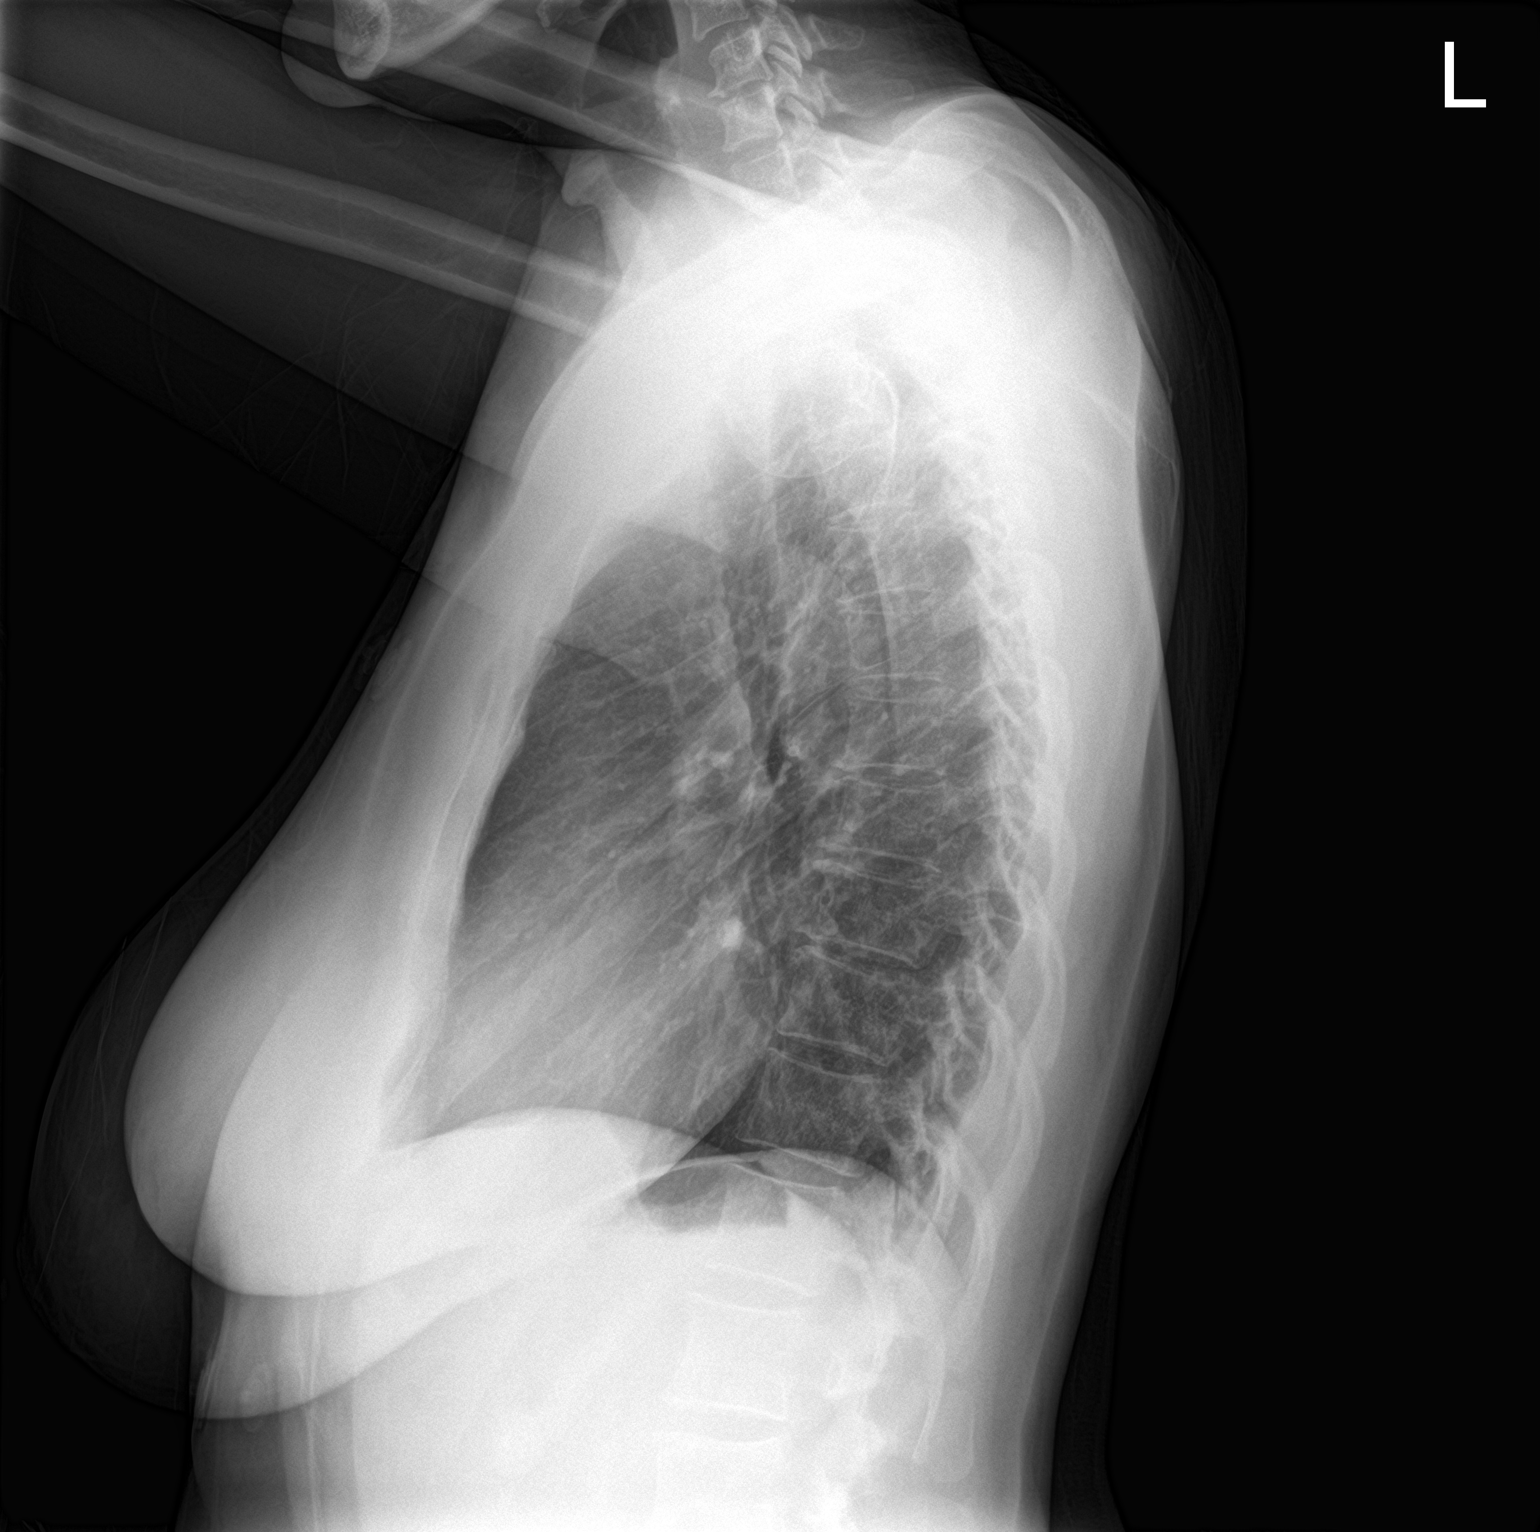

[2 of 2 positions shown; findings below may reference images not displayed]

FINDINGS: Lungs are clear.  No pleural effusion or pneumothorax.

The heart is normal in size.

Visualized osseous structures are within normal limits.
IMPRESSION: Normal chest radiographs.

## 2024-03-29 ENCOUNTER — Encounter: Payer: Self-pay | Admitting: Emergency Medicine

## 2024-03-29 ENCOUNTER — Emergency Department

## 2024-03-29 ENCOUNTER — Other Ambulatory Visit: Payer: Self-pay

## 2024-03-29 ENCOUNTER — Emergency Department
Admission: EM | Admit: 2024-03-29 | Discharge: 2024-03-29 | Disposition: A | Discharge status: Home / Self Care | Attending: Emergency Medicine | Admitting: Emergency Medicine

## 2024-03-29 DIAGNOSIS — Z853 Personal history of malignant neoplasm of breast: Secondary | ICD-10-CM | POA: Diagnosis not present

## 2024-03-29 DIAGNOSIS — R9389 Abnormal findings on diagnostic imaging of other specified body structures: Secondary | ICD-10-CM | POA: Insufficient documentation

## 2024-03-29 DIAGNOSIS — N950 Postmenopausal bleeding: Secondary | ICD-10-CM | POA: Insufficient documentation

## 2024-03-29 LAB — BASIC METABOLIC PANEL WITH GFR
Anion gap: 12 (ref 5–15)
BUN: 13 mg/dL (ref 6–20)
CO2: 22 mmol/L (ref 22–32)
Calcium: 8.8 mg/dL — ABNORMAL LOW (ref 8.9–10.3)
Chloride: 108 mmol/L (ref 98–111)
Creatinine, Ser: 0.67 mg/dL (ref 0.44–1.00)
GFR, Estimated: >60 mL/min (ref >60)
Glucose, Bld: 116 mg/dL — ABNORMAL HIGH (ref 70–99)
Potassium: 4.2 mmol/L (ref 3.5–5.1)
Sodium: 142 mmol/L (ref 135–145)

## 2024-03-29 LAB — CBC WITH DIFFERENTIAL/PLATELET
Abs Immature Granulocytes: 0.02 K/uL (ref 0.00–0.07)
Basophils Absolute: 0 K/uL (ref 0.0–0.1)
Basophils Relative: 0 %
Eosinophils Absolute: 0.2 K/uL (ref 0.0–0.5)
Eosinophils Relative: 3 %
HCT: 37.8 % (ref 36.0–46.0)
Hemoglobin: 12.4 g/dL (ref 12.0–15.0)
Immature Granulocytes: 0 %
Lymphocytes Relative: 19 %
Lymphs Abs: 1.3 K/uL (ref 0.7–4.0)
MCH: 29.2 pg (ref 26.0–34.0)
MCHC: 32.8 g/dL (ref 30.0–36.0)
MCV: 89.2 fL (ref 80.0–100.0)
Monocytes Absolute: 0.4 K/uL (ref 0.1–1.0)
Monocytes Relative: 6 %
Neutro Abs: 4.9 K/uL (ref 1.7–7.7)
Neutrophils Relative %: 72 %
Platelets: 198 K/uL (ref 150–400)
RBC: 4.24 MIL/uL (ref 3.87–5.11)
RDW: 12.7 % (ref 11.5–15.5)
WBC: 6.8 K/uL (ref 4.0–10.5)
nRBC: 0 % (ref 0.0–0.2)

## 2024-03-29 LAB — HCG, QUANTITATIVE, PREGNANCY: hCG, Beta Chain, Quant, S: 1 m[IU]/mL (ref <5)

## 2024-03-29 MED ORDER — TRANEXAMIC ACID 650 MG PO TABS: 1300.0000 mg | ORAL_TABLET | Freq: Three times a day (TID) | ORAL | 0 refills | Status: AC

## 2024-03-29 NOTE — ED Triage Notes (Signed)
 Pt reports very heavy vaginal bleeding that started last night, no abd pain, Pt is no longer having menstrual cycles. Pt reports having the same vaginal bleeding about 1 month ago and is now scheduled for an US  in 2 weeks. Pt is on oral medication for breast cancer.

## 2024-03-29 NOTE — Discharge Instructions (Addendum)
 You were seen in the ER today for evaluation of your vaginal bleeding.  This may be related to the medicine you are taking for your breast cancer.  I sent a prescription for medication to help control your bleeding.  Follow-up closely with your OB/GYN group for further evaluation.  Unless you are directed otherwise, continue to take your medications as directed.  Return to the ER for new or worsening symptoms including worsening abdominal pain or uncontrolled bleeding including saturating a pad every hour for 2 hours in a row.

## 2024-03-29 NOTE — ED Provider Notes (Signed)
 [facility] Provider Note    Event Date/Time   First MD Initiated Contact with Patient 03/29/24 1204     (approximate)   History   Vaginal Bleeding   HPI  Dominique Bryan is a 53 year old female with history of breast cancer on tamoxifen presenting to the emergency department for evaluation of vaginal bleeding.  Patient reports she stopped having regular menstrual cycles about a year ago.  Last night, she had a small amount of vaginal bleeding, but around 9 AM this morning she had onset of brisk vaginal bleeding requiring her to change a pad more than every hour.  Denies any abdominal pain.  Continues to use tamoxifen.  Did have an episode of vaginal bleeding a few weeks ago, but reports that the bleeding is much more significant today.  Not on anticoagulation.  Reviewed outpatient OB/GYN visit from 03/09/2024.  At that time patient presented with postmenopausal bleeding in the setting of discontinuing tamoxifen thought be related to endometrial changes in the setting of her tamoxifen use.  She had an endometrial biopsy performed with a plan for ultrasound to evaluate endometrial thickness.      Physical Exam   Triage Vital Signs: ED Triage Vitals  Encounter Vitals Group     BP 03/29/24 1138 (!) 100/90     Girls Systolic BP Percentile --      Girls Diastolic BP Percentile --      Boys Systolic BP Percentile --      Boys Diastolic BP Percentile --      Pulse Rate 03/29/24 1138 80     Resp 03/29/24 1138 16     Temp 03/29/24 1138 99.1 F (37.3 C)     Temp Source 03/29/24 1138 Oral     SpO2 03/29/24 1138 100 %     Weight 03/29/24 1138 155 lb (70.3 kg)     Height 03/29/24 1203 5' 2 (1.575 m)     Head Circumference --      Peak Flow --      Pain Score 03/29/24 1138 0     Pain Loc --      Pain Education --      Exclude from Growth Chart --     Most recent vital signs: Vitals:   03/29/24 1138 03/29/24 1500  BP: (!) 100/90 110/88  Pulse:  80 79  Resp: 16 16  Temp: 99.1 F (37.3 C) 98 F (36.7 C)  SpO2: 100% 100%     General: Awake, interactive  CV:  Regular rate, good peripheral perfusion.  Resp:  Unlabored respirations.  Abd:  Nondistended, soft, nontender GU:  External genitalia unremarkable.  Small to moderate amount of blood noted within the vaginal vault but cervix is able to be visualized with minimal active oozing. Vaginal wall mucosa is unremarkable. Neuro:  Symmetric facial movement, fluid speech   ED Results / Procedures / Treatments   Labs (all labs ordered are listed, but only abnormal results are displayed) Labs Reviewed  BASIC METABOLIC PANEL WITH GFR - Abnormal; Notable for the following components:      Result Value   Glucose, Bld 116 (*)    Calcium 8.8 (*)    All other components within normal limits  CBC WITH DIFFERENTIAL/PLATELET  HCG, QUANTITATIVE, PREGNANCY     EKG EKG independently reviewed and interpreted by myself demonstrates:    RADIOLOGY Imaging independently reviewed and interpreted by myself demonstrates:  US  pelvic demonstrates thickened endometrium with fibroids  Formal  Radiology Read:  US  PELVIC COMPLETE WITH TRANSVAGINAL Result Date: 03/29/2024 CLINICAL DATA:  Postmenopausal vaginal bleeding EXAM: TRANSABDOMINAL AND TRANSVAGINAL ULTRASOUND OF PELVIS TECHNIQUE: Both transabdominal and transvaginal ultrasound examinations of the pelvis were performed. Transabdominal technique was performed for global imaging of the pelvis including uterus, ovaries, adnexal regions, and pelvic cul-de-sac. It was necessary to proceed with endovaginal exam following the transabdominal exam to visualize the bilateral ovaries. COMPARISON:  None Available. FINDINGS: Uterus Measurements: 8.5 x 6.0 x 6.3 cm = volume: 167 mL. Three intramural uterine fibroids anteriorly near the fundus of the uterus, measuring approximately 1.6 x 1.2 x 1.6 cm, 0.9 x 0.9 x 1.2 cm, and 1.7 x 1.5 x 1.6 cm. Endometrium  Thickness: 14.3 mm. There is approximately 5 mm of free fluid identified within the endometrial canal, likely representing blood products. Right ovary Not visualized. Left ovary Not visualized. Other findings Trace volume free fluid along the right adnexa. IMPRESSION: 1. Thickened endometrium measuring 14.3 mm with free fluid in the endometrial canal, likely representing blood products. In the setting of post-menopausal bleeding, endometrial sampling is indicated to exclude carcinoma. If results are benign, sonohysterogram should be considered for focal lesion work-up. (Ref: Radiological Reasoning: Algorithmic Workup of Abnormal Vaginal Bleeding with Endovaginal Sonography and Sonohysterography. AJR 2008; 808:D31-26) 2.  Intramural uterine fibroids. 3.  Bilateral ovaries not visualized. Electronically Signed   By: Michaeline Blanch M.D.   On: 03/29/2024 16:20    PROCEDURES:  Critical Care performed: No  Procedures   MEDICATIONS ORDERED IN ED: Medications - No data to display   IMPRESSION / MDM / ASSESSMENT AND PLAN / ED COURSE  I reviewed the triage vital signs and the nursing notes.  Differential diagnosis includes, but is not limited to, postmenopausal bleeding in the setting of tamoxifen use, lower suspicion injury, consideration for fibroid, polyp  Patient's presentation is most consistent with acute presentation with potential threat to life or bodily function.  53 year old female presenting to the emergency department for evaluation of vaginal bleeding.  Stable vitals on presentation.  Normal hemoglobin at 12.4.  Reassuring BMP.  On exam, patient does have some active bleeding but is not brisk.  Will obtain ultrasound to further evaluate, plan for likely discussion with OB/GYN.  Ultrasound demonstrates thickened endometrium, some uterine fibroid also noted.  Case was reviewed with CNM Harlene Cisco with on-call OB/GYN.  She reviewed the patient's records.  Plan to avoid any hormonal  medicines given her history of breast cancer, but does note that we can trial a course of TXA to help control patient's bleeding.  Does recommend close follow-up with patient's OB/GYN group.  Discussed with patient who is comfortable with this plan.  Her bleeding is fortunately improving.  Strict return precautions provided.  Patient discharged stable condition.    FINAL CLINICAL IMPRESSION(S) / ED DIAGNOSES   Final diagnoses:  Postmenopausal bleeding  Thickened endometrium     Rx / DC Orders   ED Discharge Orders          Ordered    tranexamic acid  (LYSTEDA ) 650 MG TABS tablet  3 times daily        03/29/24 1708             Note:  This document was prepared using Dragon voice recognition software and may include unintentional dictation errors.   Levander Slate, MD 03/29/24 [phone]

## 2024-03-29 NOTE — Telephone Encounter (Signed)
 Received call from gyn spanish speaking patient with interpreter ID [phone] assisting with call.Patient reports vaginal bleeding that started last night. Patient states that she's saturating a 2 pads an hour passing small clots as well. Dr. Floretta made aware and instructed patient to go to the ED  for evaluation , Patient verbalized understanding.  Reason for Disposition . SEVERE vaginal bleeding (e.g., soaking 2 pads or tampons per hour and present 2 or more hours; 1 menstrual cup every 2 hours)  Additional Information . Negative: Shock suspected (e.g., cold/pale/clammy skin, too weak to stand, low BP, rapid pulse) . Negative: Difficult to awaken or acting confused (e.g., disoriented, slurred speech) . Negative: Passed out (i.e., lost consciousness, collapsed and was not responding) . Negative: Sounds like a life-threatening emergency to the triager . Negative: Followed a genital area injury (e.g., vagina, vulva) . Negative: Vaginal discharge is main symptom and small amount of blood . Negative: SEVERE abdominal pain . Negative: SEVERE dizziness (e.g., unable to stand, requires support to walk, feels like passing out now) . Negative: Sexual assault or rape (sexual intercourse or activity occurs without freely given consent), known or suspected  Protocols used: Vaginal Bleeding - Postmenopausal-A-[place]
